# Patient Record
Sex: Male | Born: 1955 | Race: White | Hispanic: No | Marital: Married | Smoking: Never smoker
Health system: Southern US, Community
[De-identification: ages and names within clinical notes are randomized; demographics above are authoritative.]

## PROBLEM LIST (undated history)

## (undated) DIAGNOSIS — I214 Non-ST elevation (NSTEMI) myocardial infarction: Secondary | ICD-10-CM

## (undated) DIAGNOSIS — K649 Unspecified hemorrhoids: Secondary | ICD-10-CM

## (undated) DIAGNOSIS — Z86010 Personal history of colonic polyps: Secondary | ICD-10-CM

## (undated) DIAGNOSIS — I10 Essential (primary) hypertension: Secondary | ICD-10-CM

## (undated) DIAGNOSIS — I2510 Atherosclerotic heart disease of native coronary artery without angina pectoris: Secondary | ICD-10-CM

## (undated) DIAGNOSIS — Z951 Presence of aortocoronary bypass graft: Secondary | ICD-10-CM

## (undated) DIAGNOSIS — H409 Unspecified glaucoma: Secondary | ICD-10-CM

## (undated) HISTORY — PX: OTHER SURGICAL HISTORY: SHX169

## (undated) HISTORY — PX: CORONARY ANGIOPLASTY: SHX604

---

## 2015-11-15 ENCOUNTER — Encounter: Payer: Self-pay | Admitting: Emergency Medicine

## 2015-11-15 ENCOUNTER — Inpatient Hospital Stay
Admission: EM | Admit: 2015-11-15 | Discharge: 2015-11-17 | DRG: 282 | Payer: BLUE CROSS/BLUE SHIELD | Attending: Internal Medicine | Admitting: Internal Medicine

## 2015-11-15 ENCOUNTER — Emergency Department: Payer: BLUE CROSS/BLUE SHIELD

## 2015-11-15 DIAGNOSIS — I214 Non-ST elevation (NSTEMI) myocardial infarction: Secondary | ICD-10-CM | POA: Diagnosis not present

## 2015-11-15 DIAGNOSIS — I25110 Atherosclerotic heart disease of native coronary artery with unstable angina pectoris: Secondary | ICD-10-CM | POA: Diagnosis present

## 2015-11-15 DIAGNOSIS — R778 Other specified abnormalities of plasma proteins: Secondary | ICD-10-CM

## 2015-11-15 DIAGNOSIS — I10 Essential (primary) hypertension: Secondary | ICD-10-CM

## 2015-11-15 DIAGNOSIS — H409 Unspecified glaucoma: Secondary | ICD-10-CM | POA: Diagnosis present

## 2015-11-15 DIAGNOSIS — R7989 Other specified abnormal findings of blood chemistry: Secondary | ICD-10-CM

## 2015-11-15 DIAGNOSIS — I200 Unstable angina: Secondary | ICD-10-CM

## 2015-11-15 DIAGNOSIS — Z8249 Family history of ischemic heart disease and other diseases of the circulatory system: Secondary | ICD-10-CM

## 2015-11-15 HISTORY — DX: Essential (primary) hypertension: I10

## 2015-11-15 HISTORY — DX: Unspecified glaucoma: H40.9

## 2015-11-15 LAB — COMPREHENSIVE METABOLIC PANEL
ALK PHOS: 119 U/L (ref 38–126)
ALT: 27 U/L (ref 17–63)
AST: 32 U/L (ref 15–41)
Albumin: 4.1 g/dL (ref 3.5–5.0)
Anion gap: 6 (ref 5–15)
BILIRUBIN TOTAL: 0.4 mg/dL (ref 0.3–1.2)
BUN: 14 mg/dL (ref 6–20)
CALCIUM: 8.9 mg/dL (ref 8.9–10.3)
CO2: 27 mmol/L (ref 22–32)
CREATININE: 0.96 mg/dL (ref 0.61–1.24)
Chloride: 105 mmol/L (ref 101–111)
GFR calc Af Amer: >60 mL/min (ref >60)
Glucose, Bld: 87 mg/dL (ref 65–99)
Potassium: 4.2 mmol/L (ref 3.5–5.1)
Sodium: 138 mmol/L (ref 135–145)
TOTAL PROTEIN: 6.9 g/dL (ref 6.5–8.1)

## 2015-11-15 LAB — CBC
HEMATOCRIT: 44 % (ref 40.0–52.0)
Hemoglobin: 15.4 g/dL (ref 13.0–18.0)
MCH: 32 pg (ref 26.0–34.0)
MCHC: 34.9 g/dL (ref 32.0–36.0)
MCV: 91.7 fL (ref 80.0–100.0)
Platelets: 154 10*3/uL (ref 150–440)
RBC: 4.8 MIL/uL (ref 4.40–5.90)
RDW: 13.1 % (ref 11.5–14.5)
WBC: 7 10*3/uL (ref 3.8–10.6)

## 2015-11-15 LAB — FIBRIN DERIVATIVES D-DIMER (ARMC ONLY): Fibrin derivatives D-dimer (ARMC): 354 (ref 0–499)

## 2015-11-15 LAB — APTT: aPTT: 31 seconds (ref 24–36)

## 2015-11-15 LAB — TROPONIN I
TROPONIN I: 0.58 ng/mL — AB (ref <0.03)
TROPONIN I: 0.47 ng/mL — AB (ref <0.03)

## 2015-11-15 LAB — PROTIME-INR
INR: 1.07
Prothrombin Time: 13.9 seconds (ref 11.4–15.2)

## 2015-11-15 MED ORDER — ACETAMINOPHEN 325 MG PO TABS
650.0000 mg | ORAL_TABLET | Freq: Four times a day (QID) | ORAL | Status: DC | PRN
  Administered 2015-11-16: 650 mg via ORAL
  Filled 2015-11-15: qty 2

## 2015-11-15 MED ORDER — ASPIRIN EC 81 MG PO TBEC
81.0000 mg | DELAYED_RELEASE_TABLET | Freq: Every day | ORAL | Status: DC
  Administered 2015-11-16 – 2015-11-17 (×2): 81 mg via ORAL
  Filled 2015-11-15 (×2): qty 1

## 2015-11-15 MED ORDER — ONDANSETRON HCL 4 MG PO TABS: 4.0000 mg | ORAL_TABLET | Freq: Four times a day (QID) | ORAL | Status: DC | PRN

## 2015-11-15 MED ORDER — ONDANSETRON HCL 4 MG/2ML IJ SOLN: 4.0000 mg | Freq: Four times a day (QID) | INTRAMUSCULAR | Status: DC | PRN

## 2015-11-15 MED ORDER — HYDRALAZINE HCL 20 MG/ML IJ SOLN: 10.0000 mg | Freq: Four times a day (QID) | INTRAMUSCULAR | Status: DC | PRN

## 2015-11-15 MED ORDER — NITROGLYCERIN 0.4 MG SL SUBL: 0.4000 mg | SUBLINGUAL_TABLET | SUBLINGUAL | Status: DC | PRN

## 2015-11-15 MED ORDER — ASPIRIN 81 MG PO CHEW
243.0000 mg | CHEWABLE_TABLET | Freq: Once | ORAL | Status: AC
  Administered 2015-11-15: 243 mg via ORAL
  Filled 2015-11-15: qty 3

## 2015-11-15 MED ORDER — FAMOTIDINE IN NACL 20-0.9 MG/50ML-% IV SOLN
20.0000 mg | Freq: Two times a day (BID) | INTRAVENOUS | Status: DC
  Administered 2015-11-15: 20 mg via INTRAVENOUS
  Filled 2015-11-15 (×4): qty 50

## 2015-11-15 MED ORDER — NITROGLYCERIN 2 % TD OINT
1.0000 [in_us] | TOPICAL_OINTMENT | Freq: Four times a day (QID) | TRANSDERMAL | Status: DC
  Administered 2015-11-15 – 2015-11-17 (×6): 1 [in_us] via TOPICAL
  Filled 2015-11-15 (×6): qty 1

## 2015-11-15 MED ORDER — BISACODYL 10 MG RE SUPP: 10.0000 mg | Freq: Every day | RECTAL | Status: DC | PRN

## 2015-11-15 MED ORDER — METOPROLOL TARTRATE 25 MG PO TABS
25.0000 mg | ORAL_TABLET | Freq: Two times a day (BID) | ORAL | Status: DC
  Administered 2015-11-15: 25 mg via ORAL
  Filled 2015-11-15: qty 1

## 2015-11-15 MED ORDER — LORAZEPAM 2 MG/ML IJ SOLN: 0.5000 mg | INTRAMUSCULAR | Status: DC | PRN

## 2015-11-15 MED ORDER — DOCUSATE SODIUM 100 MG PO CAPS
100.0000 mg | ORAL_CAPSULE | Freq: Two times a day (BID) | ORAL | Status: DC
  Administered 2015-11-16 – 2015-11-17 (×3): 100 mg via ORAL
  Filled 2015-11-15 (×4): qty 1

## 2015-11-15 MED ORDER — ACETAMINOPHEN 650 MG RE SUPP
650.0000 mg | Freq: Four times a day (QID) | RECTAL | Status: DC | PRN
Start: 2015-11-15 — End: 2015-11-17

## 2015-11-15 MED ORDER — HEPARIN (PORCINE) IN NACL 100-0.45 UNIT/ML-% IJ SOLN
1150.0000 [IU]/h | INTRAMUSCULAR | Status: DC
  Administered 2015-11-15 – 2015-11-17 (×3): 1150 [IU]/h via INTRAVENOUS
  Filled 2015-11-15 (×6): qty 250

## 2015-11-15 MED ORDER — LATANOPROST 0.005 % OP SOLN
1.0000 [drp] | Freq: Every day | OPHTHALMIC | Status: DC
  Filled 2015-11-15: qty 2.5

## 2015-11-15 MED ORDER — SODIUM CHLORIDE 0.9 % IV SOLN
INTRAVENOUS | Status: DC
  Administered 2015-11-16: 07:00:00 via INTRAVENOUS

## 2015-11-15 MED ORDER — SODIUM CHLORIDE 0.9% FLUSH
3.0000 mL | Freq: Two times a day (BID) | INTRAVENOUS | Status: DC
  Administered 2015-11-15: 3 mL via INTRAVENOUS

## 2015-11-15 MED ORDER — HEPARIN BOLUS VIA INFUSION
4000.0000 [IU] | Freq: Once | INTRAVENOUS | Status: AC
  Administered 2015-11-15: 4000 [IU] via INTRAVENOUS
  Filled 2015-11-15: qty 4000

## 2015-11-15 MED ORDER — MORPHINE SULFATE (PF) 2 MG/ML IV SOLN: 2.0000 mg | INTRAVENOUS | Status: DC | PRN

## 2015-11-15 NOTE — H&P (Signed)
History and Physical    Clinton Lyons [id number] DOB: 1956-02-25 DOA: 11/15/2015  Referring physician: Dr. Kerman Passey PCP: No primary care provider on file.  Specialists: none  Chief Complaint: chest pain  HPI: Clinton Lyons is a 60 y.o. male has a past medical history significant for glaucoma who presents to ER with recurrent episodes of left chest pain described as tightness radiating to left arm. Had an episode last night at rest lasting over 30 minutes. Denies sweats, SOB or N/V. In ER, ekg non-acute but troponin mildly elevated. He is now admitted. No personal cardiac hx but does have FH of CAD.  Review of Systems: The patient denies anorexia, fever, weight loss,, vision loss, decreased hearing, hoarseness,  syncope, dyspnea on exertion, peripheral edema, balance deficits, hemoptysis, abdominal pain, melena, hematochezia, severe indigestion/heartburn, hematuria, incontinence, genital sores, muscle weakness, suspicious skin lesions, transient blindness, difficulty walking, depression, unusual weight change, abnormal bleeding, enlarged lymph nodes, angioedema, and breast masses.   Past Medical History:  Diagnosis Date  . Glaucoma    History reviewed. No pertinent surgical history. Social History:  reports that he has never smoked. He has never used smokeless tobacco. He reports that he drinks about 1.2 oz of alcohol per week . He reports that he does not use drugs.  No Known Allergies  Family History  Problem Relation Age of Onset  . CAD Brother     Prior to Admission medications   Not on File   Physical Exam: Vitals:   11/15/15 1405 11/15/15 1408 11/15/15 1612  BP: (!) 140/97  (!) 139/91  Pulse: 92  94  Resp:   18  Temp: 98.4 F (36.9 C)    TempSrc: Oral    SpO2: 98%  97%  Weight:  93 kg (205 lb)   Height:  5\' 9"  (1.753 m)      General:  No apparent distress, WDWN, [place]/AT  Eyes: PERRL, EOMI, no scleral icterus, conjunctiva clear  ENT: moist  oropharynx without exudate, TM's benign, dentition good  Neck: supple, no lymphadenopathy. No bruits or thyromegaly  Cardiovascular: regular rate without MRG; 2+ peripheral pulses, no JVD, no peripheral edema  Respiratory: CTA biL, good air movement without wheezing, rhonchi or crackled. Respiratory effort normal  Abdomen: soft, non tender to palpation, positive bowel sounds, no guarding, no rebound  Skin: no rashes or lesions  Musculoskeletal: normal bulk and tone, no joint swelling  Psychiatric: normal mood and affect, A&OX3  Neurologic: CN 2-12 grossly intact, Motor strength 5/5 in all 4 groups with symmetric DTR's and non-focal sensory exam  Labs on Admission:  Basic Metabolic Panel:  Recent Labs Lab 11/15/15 1412  NA 138  K 4.2  CL 105  CO2 27  GLUCOSE 87  BUN 14  CREATININE 0.96  CALCIUM 8.9   Liver Function Tests:  Recent Labs Lab 11/15/15 1412  AST 32  ALT 27  ALKPHOS 119  BILITOT 0.4  PROT 6.9  ALBUMIN 4.1   No results for input(s): LIPASE, AMYLASE in the last 168 hours. No results for input(s): AMMONIA in the last 168 hours. CBC:  Recent Labs Lab 11/15/15 1412  WBC 7.0  HGB 15.4  HCT 44.0  MCV 91.7  PLT 154   Cardiac Enzymes:  Recent Labs Lab 11/15/15 1412  TROPONINI 0.58*    BNP (last 3 results) No results for input(s): BNP in the last 8760 hours.  ProBNP (last 3 results) No results for input(s): PROBNP in the last 8760 hours.  CBG: No  results for input(s): GLUCAP in the last 168 hours.  Radiological Exams on Admission: Dg Chest 2 View  Result Date: 11/15/2015 CLINICAL DATA:  Chest discomfort EXAM: CHEST  2 VIEW COMPARISON:  None. FINDINGS: The heart size and mediastinal contours are within normal limits. Both lungs are clear. The visualized skeletal structures are unremarkable. IMPRESSION: No active cardiopulmonary disease. Electronically Signed   By: Dorise Bullion III M.D   On: 11/15/2015 15:23    EKG: Independently  reviewed.  Assessment/Plan Active Problems:   Unstable angina (HCC)   Elevated troponin   Accelerated hypertension   Glaucoma   Will observe on telemetry and begin BP meds. Follow enzymes and order echo. Consult Cardiology for cath. Repeat labs in AM. On Heparin drip per ER.  Diet: low salt Fluids: NS@75  DVT Prophylaxis: IV Heparin  Code Status: FULL  Family Communication: yes  Disposition Plan: home  Time spent: 50 min

## 2015-11-15 NOTE — ED Notes (Signed)
Pt. States having "episodes of a dull achy chest pain that radiates to the left arm." denies diaphoresis, denies N/V, denies palpitations. last pm episode worst so far last approx. 20 minutes. Pt. Reports taking 2 81 mg aspirin and pain easing off. Pt. Denying pain at this time.

## 2015-11-15 NOTE — ED Provider Notes (Signed)
[facility] Emergency Department Provider Note  Time seen: 3:55 PM  I have reviewed the triage vital signs and the nursing notes.   HISTORY  Chief Complaint Chest Pain    HPI Clinton Lyons is a 60 y.o. male With a past medical history of glaucoma who presents the emergency department for chest pain. According to the patient over the past 2 weeks he has had 7 episodes of chest pain/tightness with occasional tingling and pressure in his left arm. States the symptoms have lasted for 5-10 minutes  Resolved. Patient states around 1:30 AM overnight he had an approximate 15 minute episode of chest tightness with tingling and pressure in the left arm. States it resolved on its own he went back to sleep. Patient states he was concerned enough with the then overnight that he came to the emergency department today for evaluation. States his brother recently had a cardiac bypass. The patient denies any trouble breathing, nausea or diaphoresis.   Past Medical History:  Diagnosis Date  . Glaucoma     There are no active problems to display for this patient.   History reviewed. No pertinent surgical history.  Prior to Admission medications   Not on File    No Known Allergies  History reviewed. No pertinent family history.  Social History Social History  Substance Use Topics  . Smoking status: Never Smoker  . Smokeless tobacco: Never Used  . Alcohol use 1.2 oz/week    2 Cans of beer per week    Review of Systems Constitutional: Negative for fever. Cardiovascular: Negative for chest pain. Respiratory: Negative for shortness of breath. Gastrointestinal: Negative for abdominal pain Musculoskeletal: Negative for back pain. Neurological: Negative for headache 10-point ROS otherwise negative.  ____________________________________________   PHYSICAL EXAM:  VITAL SIGNS: ED Triage Vitals  Enc Vitals Group     BP 11/15/15 1405 (!) 140/97     Pulse Rate  11/15/15 1405 92     Resp --      Temp 11/15/15 1405 98.4 F (36.9 C)     Temp Source 11/15/15 1405 Oral     SpO2 11/15/15 1405 98 %     Weight 11/15/15 1408 205 lb (93 kg)     Height 11/15/15 1408 5\' 9"  (1.753 m)     Head Circumference --      Peak Flow --      Pain Score 11/15/15 1507 0     Pain Loc --      Pain Edu? --      Excl. in [place]? --     Constitutional: Alert and oriented. Well appearing and in no distress. Eyes: Normal exam ENT   Head: Normocephalic and atraumatic   Mouth/Throat: Mucous membranes are moist. Cardiovascular: Normal rate, regular rhythm. No murmur Respiratory: Normal respiratory effort without tachypnea nor retractions. Breath sounds are clear and equal Gastrointestinal: Soft and nontender. No distention.  Musculoskeletal: Nontender with normal range of motion in all extremities. No lower extremity tenderness or edema. Neurologic:  Normal speech and language. No gross focal neurologic deficits are appreciated. Skin:  Skin is warm, dry and intact.  Psychiatric: Mood and affect are normal. Speech and behavior are normal.   ____________________________________________    EKG  EKG reviewed and interpreted by myself shows normal sinus rhythm at 87 bpm, narrow QRS, normal axis, normal intervals, no concerning ST changes.  ____________________________________________    RADIOLOGY  Chest x-ray negative  ____________________________________________   INITIAL IMPRESSION / ASSESSMENT AND PLAN /  ED COURSE  Pertinent labs & imaging results that were available during my care of the patient were reviewed by me and considered in my medical decision making (see chart for details).  The patient presents the emergency department for chest pain which has been occurring intermittently over the past 2 weeks, the worst episode occurred last night around 1:30 in the morning. Patient denies any discomfort at this time, denies any trouble breathing nausea or  diaphoresis at any point. Patient's troponin is significantly elevated 0.58 consistent with unstable angina versus NSTEMI. Patient has a normal physical exam at this time, EKG shows no concerning findings. Chest x-ray is clear. We will start the patient on a heparin drip, dose 3 additional baby aspirin, and admit to the hospital for further treatment.   CRITICAL CARE Performed by: Harvest Dark   Total critical care time: 30 minutes  Critical care time was exclusive of separately billable procedures and treating other patients.  Critical care was necessary to treat or prevent imminent or life-threatening deterioration.  Critical care was time spent personally by me on the following activities: development of treatment plan with patient and/or surrogate as well as nursing, discussions with consultants, evaluation of patient's response to treatment, examination of patient, obtaining history from patient or surrogate, ordering and performing treatments and interventions, ordering and review of laboratory studies, ordering and review of radiographic studies, pulse oximetry and re-evaluation of patient's condition.   ____________________________________________   FINAL CLINICAL IMPRESSION(S) / ED DIAGNOSES  NSTEMI    Harvest Dark, MD 11/15/15 [phone]

## 2015-11-15 NOTE — Progress Notes (Signed)
ANTICOAGULATION CONSULT NOTE - Initial Consult  Pharmacy Consult for heparin bolus and drip Indication: chest pain/ACS  No Known Allergies  Patient Measurements: Height: 5\' 9"  (175.3 cm) Weight: 205 lb (93 kg) IBW/kg (Calculated) : 70.7 Heparin Dosing Weight: 89.8  Vital Signs: Temp: 98.4 F (36.9 C) (08/20 1405) Temp Source: Oral (08/20 1405) BP: 139/91 (08/20 1612) Pulse Rate: 94 (08/20 1612)  Labs:  Recent Labs  11/15/15 1412  HGB 15.4  HCT 44.0  PLT 154  APTT 31  LABPROT 13.9  INR 1.07  CREATININE 0.96  TROPONINI 0.58*    Estimated Creatinine Clearance: 91 mL/min (by C-G formula based on SCr of 0.96 mg/dL).   Medical History: Past Medical History:  Diagnosis Date  . Glaucoma     Medications:  Scheduled:  . heparin  4,000 Units Intravenous Once  . metoprolol tartrate  25 mg Oral BID  . nitroGLYCERIN  1 inch Topical Q6H   Infusions:  . sodium chloride    . famotidine (PEPCID) IV    . heparin      Assessment: Pharmacy has been consulted to dose heparin drip and bolus for this 10 yoM. Patient reports taking no anticoagulants prior to admission. Baseline labs were ordered. HL ordered for 6 hours after start of heparin (08/20 @ 2300). CBC order with 08/21 AM labs.   Goal of Therapy:  Heparin level 0.3-0.7 units/ml Monitor platelets by anticoagulation protocol: Yes   Plan:  Give 4000 units bolus x 1 Start heparin infusion at 1150 units/hr Check anti-Xa level in 6 hours and daily while on heparin Continue to monitor H&H and platelets  Darrow Bussing, PharmD Pharmacy Resident 11/15/2015 4:58 PM

## 2015-11-15 NOTE — ED Triage Notes (Signed)
[place] Infrequent episodes of chest pressure. Had an episode where he awoke out of sleep with pain x 20 mins which then subsided.

## 2015-11-15 NOTE — ED Notes (Signed)
Admitting MD at bedside.

## 2015-11-16 ENCOUNTER — Observation Stay
Admit: 2015-11-16 | Discharge: 2015-11-16 | Disposition: A | Discharge status: Home / Self Care | Payer: BLUE CROSS/BLUE SHIELD | Attending: Internal Medicine | Admitting: Internal Medicine

## 2015-11-16 ENCOUNTER — Encounter
Admission: EM | Disposition: A | Discharge status: Other Acute Inpatient Hospital | Payer: Self-pay | Source: Home / Self Care | Attending: Internal Medicine

## 2015-11-16 DIAGNOSIS — H409 Unspecified glaucoma: Secondary | ICD-10-CM | POA: Diagnosis present

## 2015-11-16 DIAGNOSIS — I214 Non-ST elevation (NSTEMI) myocardial infarction: Secondary | ICD-10-CM

## 2015-11-16 DIAGNOSIS — Z8249 Family history of ischemic heart disease and other diseases of the circulatory system: Secondary | ICD-10-CM | POA: Diagnosis not present

## 2015-11-16 DIAGNOSIS — I10 Essential (primary) hypertension: Secondary | ICD-10-CM | POA: Diagnosis present

## 2015-11-16 DIAGNOSIS — I25110 Atherosclerotic heart disease of native coronary artery with unstable angina pectoris: Secondary | ICD-10-CM | POA: Diagnosis present

## 2015-11-16 HISTORY — DX: Non-ST elevation (NSTEMI) myocardial infarction: I21.4

## 2015-11-16 HISTORY — PX: CARDIAC CATHETERIZATION: SHX172

## 2015-11-16 LAB — COMPREHENSIVE METABOLIC PANEL WITH GFR
ALT: 22 U/L (ref 17–63)
AST: 25 U/L (ref 15–41)
Albumin: 3.4 g/dL — ABNORMAL LOW (ref 3.5–5.0)
Alkaline Phosphatase: 93 U/L (ref 38–126)
Anion gap: 4 — ABNORMAL LOW (ref 5–15)
BUN: 13 mg/dL (ref 6–20)
CO2: 29 mmol/L (ref 22–32)
Calcium: 8.5 mg/dL — ABNORMAL LOW (ref 8.9–10.3)
Chloride: 106 mmol/L (ref 101–111)
Creatinine, Ser: 0.88 mg/dL (ref 0.61–1.24)
GFR calc Af Amer: >60 mL/min
GFR calc non Af Amer: >60 mL/min
Glucose, Bld: 96 mg/dL (ref 65–99)
Potassium: 4.3 mmol/L (ref 3.5–5.1)
Sodium: 139 mmol/L (ref 135–145)
Total Bilirubin: 1 mg/dL (ref 0.3–1.2)
Total Protein: 6.1 g/dL — ABNORMAL LOW (ref 6.5–8.1)

## 2015-11-16 LAB — HEPARIN LEVEL (UNFRACTIONATED)
Heparin Unfractionated: 0.36 [IU]/mL (ref 0.30–0.70)
Heparin Unfractionated: 0.46 IU/mL (ref 0.30–0.70)

## 2015-11-16 LAB — ECHOCARDIOGRAM COMPLETE
Height: 69 in
Weight: 3372.8 oz

## 2015-11-16 LAB — TROPONIN I
Troponin I: 0.33 ng/mL
Troponin I: 0.48 ng/mL

## 2015-11-16 LAB — CBC
HEMATOCRIT: 40.7 % (ref 40.0–52.0)
Hemoglobin: 14.2 g/dL (ref 13.0–18.0)
MCH: 31.9 pg (ref 26.0–34.0)
MCHC: 34.7 g/dL (ref 32.0–36.0)
MCV: 91.9 fL (ref 80.0–100.0)
PLATELETS: 139 10*3/uL — AB (ref 150–440)
RBC: 4.43 MIL/uL (ref 4.40–5.90)
RDW: 12.8 % (ref 11.5–14.5)
WBC: 7.3 10*3/uL (ref 3.8–10.6)

## 2015-11-16 LAB — LIPID PANEL
Cholesterol: 161 mg/dL (ref 0–200)
HDL: 49 mg/dL
LDL Cholesterol: 98 mg/dL (ref 0–99)
Total CHOL/HDL Ratio: 3.3 ratio
Triglycerides: 72 mg/dL
VLDL: 14 mg/dL (ref 0–40)

## 2015-11-16 SURGERY — LEFT HEART CATH AND CORONARY ANGIOGRAPHY
Anesthesia: Moderate Sedation

## 2015-11-16 MED ORDER — METOPROLOL SUCCINATE ER 25 MG PO TB24
25.0000 mg | ORAL_TABLET | Freq: Every day | ORAL | Status: DC
  Administered 2015-11-16 – 2015-11-17 (×2): 25 mg via ORAL
  Filled 2015-11-16 (×2): qty 1

## 2015-11-16 MED ORDER — SODIUM CHLORIDE 0.9% FLUSH: 3.0000 mL | INTRAVENOUS | Status: DC | PRN

## 2015-11-16 MED ORDER — IOPAMIDOL (ISOVUE-300) INJECTION 61%
INTRAVENOUS | Status: DC | PRN
  Administered 2015-11-16: 130 mL via INTRA_ARTERIAL

## 2015-11-16 MED ORDER — ONDANSETRON HCL 4 MG/2ML IJ SOLN: 4.0000 mg | Freq: Four times a day (QID) | INTRAMUSCULAR | Status: DC | PRN

## 2015-11-16 MED ORDER — FENTANYL CITRATE (PF) 100 MCG/2ML IJ SOLN
INTRAMUSCULAR | Status: AC
  Filled 2015-11-16: qty 2

## 2015-11-16 MED ORDER — MIDAZOLAM HCL 2 MG/2ML IJ SOLN
INTRAMUSCULAR | Status: AC
  Filled 2015-11-16: qty 2

## 2015-11-16 MED ORDER — SODIUM CHLORIDE 0.9 % WEIGHT BASED INFUSION
3.0000 mL/kg/h | INTRAVENOUS | Status: AC
  Administered 2015-11-16: 3 mL/kg/h via INTRAVENOUS

## 2015-11-16 MED ORDER — HEPARIN (PORCINE) IN NACL 2-0.9 UNIT/ML-% IJ SOLN
INTRAMUSCULAR | Status: AC
  Filled 2015-11-16: qty 500

## 2015-11-16 MED ORDER — SODIUM CHLORIDE 0.9 % WEIGHT BASED INFUSION: 3.0000 mL/kg/h | INTRAVENOUS | Status: DC

## 2015-11-16 MED ORDER — SODIUM CHLORIDE 0.9% FLUSH
3.0000 mL | Freq: Two times a day (BID) | INTRAVENOUS | Status: DC
  Administered 2015-11-16 – 2015-11-17 (×2): 3 mL via INTRAVENOUS

## 2015-11-16 MED ORDER — SODIUM CHLORIDE 0.9% FLUSH: 3.0000 mL | Freq: Two times a day (BID) | INTRAVENOUS | Status: DC

## 2015-11-16 MED ORDER — MIDAZOLAM HCL 2 MG/2ML IJ SOLN
INTRAMUSCULAR | Status: DC | PRN
  Administered 2015-11-16: 1 mg via INTRAVENOUS

## 2015-11-16 MED ORDER — LORAZEPAM 2 MG/ML IJ SOLN: 0.5000 mg | Freq: Two times a day (BID) | INTRAMUSCULAR | Status: DC | PRN

## 2015-11-16 MED ORDER — ACETAMINOPHEN 325 MG PO TABS: 650.0000 mg | ORAL_TABLET | ORAL | Status: DC | PRN

## 2015-11-16 MED ORDER — SODIUM CHLORIDE 0.9 % IV SOLN: 250.0000 mL | INTRAVENOUS | Status: DC | PRN

## 2015-11-16 MED ORDER — SODIUM CHLORIDE 0.9 % WEIGHT BASED INFUSION: 1.0000 mL/kg/h | INTRAVENOUS | Status: DC

## 2015-11-16 MED ORDER — ATORVASTATIN CALCIUM 20 MG PO TABS
40.0000 mg | ORAL_TABLET | Freq: Every day | ORAL | Status: DC
Start: 2015-11-16 — End: 2015-11-17
  Administered 2015-11-16: 40 mg via ORAL
  Filled 2015-11-16: qty 2

## 2015-11-16 MED ORDER — FENTANYL CITRATE (PF) 100 MCG/2ML IJ SOLN
INTRAMUSCULAR | Status: DC | PRN
  Administered 2015-11-16: 25 ug via INTRAVENOUS

## 2015-11-16 MED ORDER — ASPIRIN 81 MG PO CHEW: 81.0000 mg | CHEWABLE_TABLET | ORAL | Status: DC

## 2015-11-16 SURGICAL SUPPLY — 9 items
CATH INFINITI 5FR ANG PIGTAIL (CATHETERS) ×3 IMPLANT
CATH INFINITI 5FR JL4 (CATHETERS) ×3 IMPLANT
CATH INFINITI JR4 5F (CATHETERS) ×3 IMPLANT
DEVICE CLOSURE MYNXGRIP 5F (Vascular Products) ×3 IMPLANT
KIT MANI 3VAL PERCEP (MISCELLANEOUS) ×3 IMPLANT
NEEDLE PERC 18GX7CM (NEEDLE) ×3 IMPLANT
PACK CARDIAC CATH (CUSTOM PROCEDURE TRAY) ×3 IMPLANT
SHEATH AVANTI 5FR X 11CM (SHEATH) ×3 IMPLANT
WIRE EMERALD 3MM-J .035X150CM (WIRE) ×3 IMPLANT

## 2015-11-16 NOTE — Progress Notes (Signed)
*  PRELIMINARY RESULTS* Echocardiogram 2D Echocardiogram has been performed.  Sherrie Sport 11/16/2015, 9:58 AM

## 2015-11-16 NOTE — Consult Note (Signed)
[facility] Cardiology  CARDIOLOGY CONSULT NOTE  Patient ID: Clinton Lyons MRN: [id number] DOB/AGE: 1955-09-02 60 y.o.  Admit date: 11/15/2015 Referring Physician [place] Primary Physician [facility] Primary Cardiologist Arash Karstens Reason for Consultation Non-ST elevation myocardial infarction  HPI: 60 year old gentleman referred for evaluation of non-ST elevation myocardial infarction. The patient recently moved to [place] from [place]. He reports two-month history of chest pain. He developed substernal chest tightness with radiation to his left arm with exertion, with such activities as lifting furniture, walking up steps or up hill. He was in his usual state of health until the night of admission, developed substernal chest pain while laying in bed. Patient presented to [facility] emergency room where ECG was nondiagnostic. Troponin was elevated to 0.58.  Review of systems complete and found to be negative unless listed above     Past Medical History:  Diagnosis Date  . Glaucoma     History reviewed. No pertinent surgical history.  Prescriptions Prior to Admission  Medication Sig Dispense Refill Last Dose  . aspirin 81 MG chewable tablet Chew 81 mg by mouth every morning.   11/15/2015 at Unknown time  . Cholecalciferol (VITAMIN D3) 2000 units capsule Take 2,000 Units by mouth every morning.   11/15/2015 at Unknown time  . glucosamine-chondroitin 500-400 MG tablet Take 1 tablet by mouth every morning.   11/15/2015 at Unknown time  . latanoprost (XALATAN) 0.005 % ophthalmic solution Place 1 drop into the right eye at bedtime.   11/14/2015 at Unknown time  . Multiple Vitamins-Minerals (PX ADVANCED FORMULA MULTIVITS) TABS Take 1 tablet by mouth every morning.   11/15/2015 at Unknown time   Social History   Social History  . Marital status: Married    Spouse name: N/A  . Number of children: N/A  . Years of education: N/A   Occupational History  . Not on file.   Social History Main Topics   . Smoking status: Never Smoker  . Smokeless tobacco: Never Used  . Alcohol use 1.2 oz/week    2 Cans of beer per week  . Drug use: No  . Sexual activity: Not on file   Other Topics Concern  . Not on file   Social History Narrative  . No narrative on file    Family History  Problem Relation Age of Onset  . CAD Brother       Review of systems complete and found to be negative unless listed above      PHYSICAL EXAM  General: Well developed, well nourished, in no acute distress HEENT:  Normocephalic and atramatic Neck:  No JVD.  Lungs: Clear bilaterally to auscultation and percussion. Heart: HRRR . Normal S1 and S2 without gallops or murmurs.  Abdomen: Bowel sounds are positive, abdomen soft and non-tender  Msk:  Back normal, normal gait. Normal strength and tone for age. Extremities: No clubbing, cyanosis or edema.   Neuro: Alert and oriented X 3. Psych:  Good affect, responds appropriately  Labs:   Lab Results  Component Value Date   WBC 7.3 11/16/2015   HGB 14.2 11/16/2015   HCT 40.7 11/16/2015   MCV 91.9 11/16/2015   PLT 139 (L) 11/16/2015    Recent Labs Lab 11/16/15 0605  NA 139  K 4.3  CL 106  CO2 29  BUN 13  CREATININE 0.88  CALCIUM 8.5*  PROT 6.1*  BILITOT 1.0  ALKPHOS 93  ALT 22  AST 25  GLUCOSE 96   Lab Results  Component Value Date  TROPONINI 0.33 (HH) 11/16/2015    Lab Results  Component Value Date   CHOL 161 11/16/2015   Lab Results  Component Value Date   HDL 49 11/16/2015   Lab Results  Component Value Date   LDLCALC 98 11/16/2015   Lab Results  Component Value Date   TRIG 72 11/16/2015   Lab Results  Component Value Date   CHOLHDL 3.3 11/16/2015   No results found for: LDLDIRECT    Radiology: Dg Chest 2 View  Result Date: 11/15/2015 CLINICAL DATA:  Chest discomfort EXAM: CHEST  2 VIEW COMPARISON:  None. FINDINGS: The heart size and mediastinal contours are within normal limits. Both lungs are clear. The  visualized skeletal structures are unremarkable. IMPRESSION: No active cardiopulmonary disease. Electronically Signed   By: Dorise Bullion III M.D   On: 11/15/2015 15:23    EKG: Sinus rhythm  ASSESSMENT AND PLAN:   1. Non-ST elevation myocardial infarction, currently chest pain-free  Recommendations  1. Agree with current therapy 2. Proceed with cardiac catheterization with selective coronary arteriography. The risks, benefits alternatives of cardiac catheterization were explained to the patient and informed consent was obtained.   SignedIsaias Cowman MD,PhD, [facility] 11/16/2015, 7:52 AM

## 2015-11-16 NOTE — Progress Notes (Signed)
Pt taken to cath lab. Heparin gtt stopped

## 2015-11-16 NOTE — Progress Notes (Signed)
Report to susan on telemetry.  Check right groin for bleeding or hematoma.  Patient will be on bedrest for 1 hours post sheath pull---out of bed at 13:40.  Bilateral pulses are 2's DP's.Marland Kitchen

## 2015-11-16 NOTE — Progress Notes (Signed)
ANTICOAGULATION CONSULT NOTE - Initial Consult  Pharmacy Consult for heparin bolus and drip Indication: chest pain/ACS  No Known Allergies  Patient Measurements: Height: 5\' 9"  (175.3 cm) Weight: 209 lb 1.6 oz (94.8 kg) IBW/kg (Calculated) : 70.7 Heparin Dosing Weight: 89.8  Vital Signs: Temp: 98.2 F (36.8 C) (08/20 2045) Temp Source: Oral (08/20 2045) BP: 117/78 (08/20 2341) Pulse Rate: 75 (08/20 2341)  Labs:  Recent Labs  11/15/15 1412 11/15/15 1753 11/15/15 2348  HGB 15.4  --   --   HCT 44.0  --   --   PLT 154  --   --   APTT 31  --   --   LABPROT 13.9  --   --   INR 1.07  --   --   HEPARINUNFRC  --   --  0.46  CREATININE 0.96  --   --   TROPONINI 0.58* 0.47*  --     Estimated Creatinine Clearance: 91.8 mL/min (by C-G formula based on SCr of 0.96 mg/dL).   Medical History: Past Medical History:  Diagnosis Date  . Glaucoma     Medications:  Scheduled:  . aspirin EC  81 mg Oral Daily  . docusate sodium  100 mg Oral BID  . famotidine (PEPCID) IV  20 mg Intravenous Q12H  . latanoprost  1 drop Right Eye QHS  . metoprolol tartrate  25 mg Oral BID  . nitroGLYCERIN  1 inch Topical Q6H  . sodium chloride flush  3 mL Intravenous Q12H   Infusions:  . sodium chloride    . heparin 1,150 Units/hr (11/15/15 1756)    Assessment: Pharmacy has been consulted to dose heparin drip and bolus for this 32 yoM. Patient reports taking no anticoagulants prior to admission. Baseline labs were ordered. HL ordered for 6 hours after start of heparin (08/20 @ 2300). CBC order with 08/21 AM labs.   Goal of Therapy:  Heparin level 0.3-0.7 units/ml Monitor platelets by anticoagulation protocol: Yes   Plan:  Give 4000 units bolus x 1 Start heparin infusion at 1150 units/hr Check anti-Xa level in 6 hours and daily while on heparin Continue to monitor H&H and platelets   8/21:  HL @ 23:48 = 0.46 Will continue this pt on current rate and recheck HL in 6 hrs.    Jaidynn Balster D 11/16/2015 12:39 AM

## 2015-11-16 NOTE — Progress Notes (Signed)
ANTICOAGULATION CONSULT NOTE - Initial Consult  Pharmacy Consult for heparin bolus and drip Indication: chest pain/ACS  No Known Allergies  Patient Measurements: Height: 5\' 9"  (175.3 cm) Weight: 209 lb 1.6 oz (94.8 kg) IBW/kg (Calculated) : 70.7 Heparin Dosing Weight: 89.8  Vital Signs: Temp: 98.3 F (36.8 C) (08/21 0522) Temp Source: Oral (08/21 0522) BP: 113/77 (08/21 0522) Pulse Rate: 75 (08/21 0522)  Labs:  Recent Labs  11/15/15 1412 11/15/15 1753 11/15/15 2348 11/16/15 0605  HGB 15.4  --   --  14.2  HCT 44.0  --   --  40.7  PLT 154  --   --  139*  APTT 31  --   --   --   LABPROT 13.9  --   --   --   INR 1.07  --   --   --   HEPARINUNFRC  --   --  0.46 0.36  CREATININE 0.96  --   --  0.88  TROPONINI 0.58* 0.47* 0.48*  --     Estimated Creatinine Clearance: 100.1 mL/min (by C-G formula based on SCr of 0.88 mg/dL).   Medical History: Past Medical History:  Diagnosis Date  . Glaucoma     Medications:  Scheduled:  . aspirin EC  81 mg Oral Daily  . docusate sodium  100 mg Oral BID  . famotidine (PEPCID) IV  20 mg Intravenous Q12H  . latanoprost  1 drop Right Eye QHS  . metoprolol tartrate  25 mg Oral BID  . nitroGLYCERIN  1 inch Topical Q6H  . sodium chloride flush  3 mL Intravenous Q12H   Infusions:  . sodium chloride 75 mL/hr at 11/16/15 [id number]  . heparin 1,150 Units/hr (11/15/15 1756)    Assessment: Pharmacy has been consulted to dose heparin drip and bolus for this 69 yoM. Patient reports taking no anticoagulants prior to admission. Baseline labs were ordered. HL ordered for 6 hours after start of heparin (08/20 @ 2300). CBC order with 08/21 AM labs.   Goal of Therapy:  Heparin level 0.3-0.7 units/ml Monitor platelets by anticoagulation protocol: Yes   Plan:  Give 4000 units bolus x 1 Start heparin infusion at 1150 units/hr Check anti-Xa level in 6 hours and daily while on heparin Continue to monitor H&H and platelets   8/20:  HL @ 23:48 =  0.46 Will continue this pt on current rate and recheck HL in 6 hrs.   8/21: HL @ 0600 = 0.36 Will continue this pt on current rate and recheck HL on 8/22 with AM labs.   Kinslea Frances D 11/16/2015 6:43 AM

## 2015-11-16 NOTE — Progress Notes (Signed)
[place] [facility] [place] NAME: Clinton Lyons    MR#:  [id number]  DATE OF BIRTH:  [birth year]  SUBJECTIVE:  CHIEF COMPLAINT:   Chief Complaint  Patient presents with  . Chest Pain   No further chest pain  Cath later today On heparin drip  REVIEW OF SYSTEMS:    Review of Systems  Constitutional: Negative for chills and fever.  HENT: Negative for sore throat.   Eyes: Negative for blurred vision, double vision and pain.  Respiratory: Negative for cough, hemoptysis, shortness of breath and wheezing.   Cardiovascular: Negative for chest pain, palpitations, orthopnea and leg swelling.  Gastrointestinal: Negative for abdominal pain, constipation, diarrhea, heartburn, nausea and vomiting.  Genitourinary: Negative for dysuria and hematuria.  Musculoskeletal: Negative for back pain and joint pain.  Skin: Negative for rash.  Neurological: Negative for sensory change, speech change, focal weakness and headaches.  Endo/Heme/Allergies: Does not bruise/bleed easily.  Psychiatric/Behavioral: Negative for depression. The patient is not nervous/anxious.     DRUG ALLERGIES:  No Known Allergies  VITALS:  Blood pressure (!) 128/93, pulse 92, temperature 98.2 F (36.8 C), temperature source Oral, resp. rate (!) 23, height 5\' 9"  (1.753 m), weight 95.6 kg (210 lb 12.8 oz), SpO2 98 %.  PHYSICAL EXAMINATION:   Physical Exam  GENERAL:  60 y.o.-year-old patient lying in the bed with no acute distress.  EYES: Pupils equal, round, reactive to light and accommodation. No scleral icterus. Extraocular muscles intact.  HEENT: Head atraumatic, normocephalic. Oropharynx and nasopharynx clear.  NECK:  Supple, no jugular venous distention. No thyroid enlargement, no tenderness.  LUNGS: Normal breath sounds bilaterally, no wheezing, rales, rhonchi. No use of accessory muscles of respiration.  CARDIOVASCULAR: S1, S2 normal. No murmurs, rubs, or gallops.   ABDOMEN: Soft, nontender, nondistended. Bowel sounds present. No organomegaly or mass.  EXTREMITIES: No cyanosis, clubbing or edema b/l.    NEUROLOGIC: Cranial nerves II through XII are intact. No focal Motor or sensory deficits b/l.   PSYCHIATRIC: The patient is alert and oriented x 3.  SKIN: No obvious rash, lesion, or ulcer.   LABORATORY PANEL:   CBC  Recent Labs Lab 11/16/15 0605  WBC 7.3  HGB 14.2  HCT 40.7  PLT 139*   ------------------------------------------------------------------------------------------------------------------ Chemistries   Recent Labs Lab 11/16/15 0605  NA 139  K 4.3  CL 106  CO2 29  GLUCOSE 96  BUN 13  CREATININE 0.88  CALCIUM 8.5*  AST 25  ALT 22  ALKPHOS 93  BILITOT 1.0   ------------------------------------------------------------------------------------------------------------------  Cardiac Enzymes  Recent Labs Lab 11/16/15 0605  TROPONINI 0.33*   ------------------------------------------------------------------------------------------------------------------  RADIOLOGY:  Dg Chest 2 View  Result Date: 11/15/2015 CLINICAL DATA:  Chest discomfort EXAM: CHEST  2 VIEW COMPARISON:  None. FINDINGS: The heart size and mediastinal contours are within normal limits. Both lungs are clear. The visualized skeletal structures are unremarkable. IMPRESSION: No active cardiopulmonary disease. Electronically Signed   By: Dorise Bullion III M.D   On: 11/15/2015 15:23     ASSESSMENT AND PLAN:   * NSTEMI ASA, Statin, BB Check Echo. Cath later today. Appreciate cardiology input Now chest pain free Heparin drip  * DVT prophylaxis On heparin drip  All the records are reviewed and case discussed with Care Management/Social Workerr. Management plans discussed with the patient, family and they are in agreement.  CODE STATUS: FULL CODE  DVT Prophylaxis: SCDs  TOTAL TIME TAKING CARE OF THIS PATIENT: 35 minutes.  POSSIBLE D/C IN  1-2 DAYS, DEPENDING ON CLINICAL CONDITION.  Hillary Bow R M.D on 11/16/2015 at 2:29 PM  Between 7am to 6pm - Pager - [phone]  After 6pm go to [email or web address] - password [email or web address]  Office  [phone]  CC: Primary care physician; [facility]  Note: This dictation was prepared with Dragon dictation along with smaller phrase technology. Any transcriptional errors that result from this process are unintentional.

## 2015-11-16 NOTE — Progress Notes (Signed)
A&O. Independent. On IV fluids and heparin drip. Possible cardiac cath today.

## 2015-11-17 ENCOUNTER — Inpatient Hospital Stay (HOSPITAL_COMMUNITY)
Admission: AD | Admit: 2015-11-17 | Discharge: 2015-11-24 | DRG: 236 | Disposition: A | Discharge status: Home / Self Care | Payer: BLUE CROSS/BLUE SHIELD | Source: Other Acute Inpatient Hospital | Attending: Thoracic Surgery (Cardiothoracic Vascular Surgery) | Admitting: Thoracic Surgery (Cardiothoracic Vascular Surgery)

## 2015-11-17 ENCOUNTER — Encounter: Payer: Self-pay | Admitting: Cardiology

## 2015-11-17 ENCOUNTER — Inpatient Hospital Stay (HOSPITAL_COMMUNITY): Payer: BLUE CROSS/BLUE SHIELD

## 2015-11-17 ENCOUNTER — Other Ambulatory Visit: Payer: Self-pay | Admitting: *Deleted

## 2015-11-17 DIAGNOSIS — D62 Acute posthemorrhagic anemia: Secondary | ICD-10-CM | POA: Diagnosis not present

## 2015-11-17 DIAGNOSIS — R7989 Other specified abnormal findings of blood chemistry: Secondary | ICD-10-CM | POA: Diagnosis present

## 2015-11-17 DIAGNOSIS — Z01810 Encounter for preprocedural cardiovascular examination: Secondary | ICD-10-CM

## 2015-11-17 DIAGNOSIS — Z951 Presence of aortocoronary bypass graft: Secondary | ICD-10-CM

## 2015-11-17 DIAGNOSIS — H409 Unspecified glaucoma: Secondary | ICD-10-CM | POA: Diagnosis present

## 2015-11-17 DIAGNOSIS — I4891 Unspecified atrial fibrillation: Secondary | ICD-10-CM | POA: Diagnosis not present

## 2015-11-17 DIAGNOSIS — I081 Rheumatic disorders of both mitral and tricuspid valves: Secondary | ICD-10-CM | POA: Diagnosis present

## 2015-11-17 DIAGNOSIS — I2510 Atherosclerotic heart disease of native coronary artery without angina pectoris: Secondary | ICD-10-CM | POA: Diagnosis present

## 2015-11-17 DIAGNOSIS — Z7982 Long term (current) use of aspirin: Secondary | ICD-10-CM

## 2015-11-17 DIAGNOSIS — J9811 Atelectasis: Secondary | ICD-10-CM | POA: Diagnosis not present

## 2015-11-17 DIAGNOSIS — I214 Non-ST elevation (NSTEMI) myocardial infarction: Secondary | ICD-10-CM | POA: Diagnosis present

## 2015-11-17 DIAGNOSIS — E785 Hyperlipidemia, unspecified: Secondary | ICD-10-CM | POA: Diagnosis present

## 2015-11-17 DIAGNOSIS — I10 Essential (primary) hypertension: Secondary | ICD-10-CM | POA: Diagnosis present

## 2015-11-17 DIAGNOSIS — I200 Unstable angina: Secondary | ICD-10-CM | POA: Diagnosis present

## 2015-11-17 DIAGNOSIS — E8770 Fluid overload, unspecified: Secondary | ICD-10-CM | POA: Diagnosis not present

## 2015-11-17 DIAGNOSIS — R778 Other specified abnormalities of plasma proteins: Secondary | ICD-10-CM | POA: Diagnosis present

## 2015-11-17 DIAGNOSIS — I25110 Atherosclerotic heart disease of native coronary artery with unstable angina pectoris: Secondary | ICD-10-CM | POA: Diagnosis present

## 2015-11-17 DIAGNOSIS — Z8249 Family history of ischemic heart disease and other diseases of the circulatory system: Secondary | ICD-10-CM | POA: Diagnosis not present

## 2015-11-17 HISTORY — DX: Presence of aortocoronary bypass graft: Z95.1

## 2015-11-17 HISTORY — DX: Non-ST elevation (NSTEMI) myocardial infarction: I21.4

## 2015-11-17 HISTORY — DX: Atherosclerotic heart disease of native coronary artery without angina pectoris: I25.10

## 2015-11-17 HISTORY — DX: Essential (primary) hypertension: I10

## 2015-11-17 LAB — PULMONARY FUNCTION TEST
DL/VA % pred: 92 %
DL/VA: 4.23 ml/min/mmHg/L
DLCO unc % pred: 83 %
DLCO unc: 25.98 ml/min/mmHg
FEF 25-75 PRE: 3.06 L/s
FEF 25-75 Post: 3.78 L/sec
FEF2575-%CHANGE-POST: 23 %
FEF2575-%Pred-Post: 135 %
FEF2575-%Pred-Pre: 109 %
FEV1-%Change-Post: 7 %
FEV1-%PRED-POST: 102 %
FEV1-%Pred-Pre: 95 %
FEV1-POST: 3.49 L
FEV1-PRE: 3.26 L
FEV1FVC-%CHANGE-POST: 7 %
FEV1FVC-%Pred-Pre: 100 %
FEV6-%CHANGE-POST: 0 %
FEV6-%PRED-PRE: 99 %
FEV6-%Pred-Post: 99 %
FEV6-POST: 4.3 L
FEV6-PRE: 4.28 L
FEV6FVC-%Change-Post: 0 %
FEV6FVC-%PRED-PRE: 104 %
FEV6FVC-%Pred-Post: 105 %
FVC-%CHANGE-POST: 0 %
FVC-%Pred-Post: 94 %
FVC-%Pred-Pre: 95 %
FVC-POST: 4.3 L
FVC-Pre: 4.32 L
POST FEV6/FVC RATIO: 100 %
Post FEV1/FVC ratio: 81 %
Pre FEV1/FVC ratio: 76 %
Pre FEV6/FVC Ratio: 99 %
RV % PRED: 201 %
RV: 4.5 L
TLC % pred: 131 %
TLC: 8.92 L

## 2015-11-17 LAB — URINALYSIS, ROUTINE W REFLEX MICROSCOPIC
Bilirubin Urine: NEGATIVE
GLUCOSE, UA: NEGATIVE mg/dL
Hgb urine dipstick: NEGATIVE
Ketones, ur: 15 mg/dL — AB
LEUKOCYTES UA: NEGATIVE
Nitrite: NEGATIVE
PH: 6.5 (ref 5.0–8.0)
PROTEIN: NEGATIVE mg/dL
SPECIFIC GRAVITY, URINE: 1.01 (ref 1.005–1.030)

## 2015-11-17 LAB — CBC
HCT: 42.2 % (ref 40.0–52.0)
Hemoglobin: 14.6 g/dL (ref 13.0–18.0)
MCH: 32.1 pg (ref 26.0–34.0)
MCHC: 34.7 g/dL (ref 32.0–36.0)
MCV: 92.4 fL (ref 80.0–100.0)
PLATELETS: 139 10*3/uL — AB (ref 150–440)
RBC: 4.56 MIL/uL (ref 4.40–5.90)
RDW: 13.1 % (ref 11.5–14.5)
WBC: 8.8 10*3/uL (ref 3.8–10.6)

## 2015-11-17 LAB — HEPARIN LEVEL (UNFRACTIONATED): HEPARIN UNFRACTIONATED: 0.54 [IU]/mL (ref 0.30–0.70)

## 2015-11-17 MED ORDER — ONDANSETRON HCL 4 MG/2ML IJ SOLN
4.0000 mg | Freq: Four times a day (QID) | INTRAMUSCULAR | Status: DC | PRN
Start: 2015-11-17 — End: 2015-11-19

## 2015-11-17 MED ORDER — SODIUM CHLORIDE 0.9% FLUSH
3.0000 mL | Freq: Two times a day (BID) | INTRAVENOUS | Status: DC
  Administered 2015-11-17 – 2015-11-18 (×3): 3 mL via INTRAVENOUS

## 2015-11-17 MED ORDER — ACETAMINOPHEN 325 MG PO TABS: 650.0000 mg | ORAL_TABLET | Freq: Four times a day (QID) | ORAL | Status: DC | PRN

## 2015-11-17 MED ORDER — ASPIRIN EC 81 MG PO TBEC
81.0000 mg | DELAYED_RELEASE_TABLET | Freq: Every day | ORAL | Status: DC
Start: 2015-11-17 — End: 2015-11-19
  Administered 2015-11-18 – 2015-11-19 (×2): 81 mg via ORAL
  Filled 2015-11-17 (×2): qty 1

## 2015-11-17 MED ORDER — TRAMADOL HCL 50 MG PO TABS: 50.0000 mg | ORAL_TABLET | Freq: Four times a day (QID) | ORAL | Status: DC | PRN

## 2015-11-17 MED ORDER — ALBUTEROL SULFATE (2.5 MG/3ML) 0.083% IN NEBU
2.5000 mg | INHALATION_SOLUTION | Freq: Once | RESPIRATORY_TRACT | Status: AC
  Administered 2015-11-17: 2.5 mg via RESPIRATORY_TRACT

## 2015-11-17 MED ORDER — METOPROLOL TARTRATE 12.5 MG HALF TABLET: 12.5000 mg | ORAL_TABLET | Freq: Two times a day (BID) | ORAL | Status: DC

## 2015-11-17 MED ORDER — NITROGLYCERIN 0.4 MG SL SUBL: 0.4000 mg | SUBLINGUAL_TABLET | SUBLINGUAL | Status: DC | PRN

## 2015-11-17 MED ORDER — ACETAMINOPHEN 650 MG RE SUPP: 650.0000 mg | Freq: Four times a day (QID) | RECTAL | Status: DC | PRN

## 2015-11-17 MED ORDER — SENNOSIDES-DOCUSATE SODIUM 8.6-50 MG PO TABS: 1.0000 | ORAL_TABLET | Freq: Every evening | ORAL | Status: DC | PRN

## 2015-11-17 MED ORDER — NITROGLYCERIN 2 % TD OINT
1.0000 [in_us] | TOPICAL_OINTMENT | Freq: Four times a day (QID) | TRANSDERMAL | Status: DC
  Administered 2015-11-17 – 2015-11-19 (×6): 1 [in_us] via TOPICAL
  Filled 2015-11-17 (×2): qty 30

## 2015-11-17 MED ORDER — SODIUM CHLORIDE 0.9 % IV SOLN: 250.0000 mL | INTRAVENOUS | Status: DC | PRN

## 2015-11-17 MED ORDER — ADULT MULTIVITAMIN W/MINERALS CH
1.0000 | ORAL_TABLET | Freq: Every day | ORAL | Status: DC
  Administered 2015-11-18: 1 via ORAL
  Filled 2015-11-17: qty 1

## 2015-11-17 MED ORDER — SODIUM CHLORIDE 0.9% FLUSH: 3.0000 mL | INTRAVENOUS | Status: DC | PRN

## 2015-11-17 MED ORDER — ATORVASTATIN CALCIUM 40 MG PO TABS
40.0000 mg | ORAL_TABLET | Freq: Every day | ORAL | Status: DC
  Administered 2015-11-17 – 2015-11-18 (×2): 40 mg via ORAL
  Filled 2015-11-17 (×2): qty 1

## 2015-11-17 MED ORDER — METOPROLOL TARTRATE 25 MG PO TABS
25.0000 mg | ORAL_TABLET | Freq: Two times a day (BID) | ORAL | Status: DC
  Administered 2015-11-17 – 2015-11-19 (×4): 25 mg via ORAL
  Filled 2015-11-17 (×4): qty 1

## 2015-11-17 MED ORDER — HEPARIN (PORCINE) IN NACL 100-0.45 UNIT/ML-% IJ SOLN
1250.0000 [IU]/h | INTRAMUSCULAR | Status: DC
  Administered 2015-11-17 (×2): 1150 [IU]/h via INTRAVENOUS
  Filled 2015-11-17 (×2): qty 250

## 2015-11-17 MED ORDER — ONDANSETRON HCL 4 MG PO TABS: 4.0000 mg | ORAL_TABLET | Freq: Four times a day (QID) | ORAL | Status: DC | PRN

## 2015-11-17 NOTE — Progress Notes (Signed)
Pre-op Cardiac Surgery  Carotid Findings:  There is no obvious evidence of hemodynamically significant internal carotid artery stenosis bilaterally. Vertebral arteries are patent with antegrade flow.  Upper Extremity Right Left  Brachial Pressures Triphasic 155-Triphasic  Radial Waveforms Triphasic Triphasic  Ulnar Waveforms Triphasic Triphasic  Palmar Arch (Allen's Test) Within normal limits Within normal limits.    Lower  Extremity Right Left  Dorsalis Pedis Triphasic Triphasic  Posterior Tibial Triphasic Triphasic   11/17/2015 4:07 PM Maudry Mayhew, BS, RVT, RDCS, RDMS

## 2015-11-17 NOTE — Progress Notes (Signed)
[facility] currently here to transport pt to [facility].  Tele removed and wife at bedside.

## 2015-11-17 NOTE — Progress Notes (Signed)
Pt transferred to 2w17 via [facility]. VSS. Hooked up to tele monitor. MD paged. Call bell and phone within reach. Will continue to monitor.

## 2015-11-17 NOTE — H&P (Signed)
[address]       [place] [address]             [phone]        Kenshawn Creveling [place] Medical Record [id number] Date of Birth: 02/25/1956  Referring: Dr. Isaias Cowman  Primary Care: [facility]  Chief Complaint:   S/p NSTEMI, coronary artery disease   History of Present Illness:     This is a 60 year old male  who was transferred from [facility] for consideration of coronary artery bypass grafting surgery. The patient recently moved to [place] from [place]. He reports two-month history of chest pain. He developed substernal chest tightness with radiation to his left arm with exertion, with such activities as lifting furniture, walking up steps or up hill. He was in his usual state of health until the night of 11/16/2015 when he developed substernal chest pain while laying in bed. He initially presented to [facility] emergency room where ECG was nondiagnostic. Troponin was elevated to 0.58. He ruled in for a NSTEMI. He underwent a cardiac catheterization on 08/21 by Dr. Saralyn Pilar. Results showed a proximal 99% stenosis of the LAD, ostial circumflex to proximal circumflex 99% stenosis, and a 60% ostial left main stenosis. Currently, he denies chest pain or shortness of breath.   Current Activity/ Functional Status: Patient is independent with mobility/ambulation, transfers, ADL's, IADL's.   Zubrod Score: At the time of surgery this patient's most appropriate activity status/level should be described as: []     0    Normal activity, no symptoms []     1    Restricted in physical strenuous activity but ambulatory, able to do out light work [x]     2    Ambulatory and capable of self care, unable to do work activities, up and about                 more than 50%  Of the time                            []     3    Only limited self care, in bed greater than 50% of waking hours []     4    Completely disabled, no self care,  confined to bed or chair []     5    Moribund  Past Medical History:  Diagnosis Date  . Glaucoma     Past Surgical History:  Procedure Laterality Date  . CARDIAC CATHETERIZATION N/A 11/16/2015   Procedure: Left Heart Cath and Coronary Angiography;  Surgeon: Isaias Cowman, MD;  Location: [place] [facility];  Service: Cardiovascular;  Laterality: N/A;  Arthroscopic right knee surgery for meniscal injury Broken nose many years ago  Social History   Social History  . Marital status: Married    Spouse name: N/A  . Number of children: 2 daughters (one in [employer] school and one in graduate school at [employer])  . Years of education: N/A   Occupational History  . Retired. 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   Social History Main Topics  . Smoking status: Never Smoker  . Smokeless tobacco: Never Used  . Alcohol use 1.2 oz/week    2 Cans of beer per week  . Drug use: No  . Sexual activity: Not on file    Allergies: No Known Allergies   Prescriptions Prior to Admission  Medication Sig  Dispense Refill Last Dose  . aspirin 81 MG chewable tablet Chew 81 mg by mouth every morning.   11/15/2015  . Cholecalciferol (VITAMIN D3) 2000 units capsule Take 4,000 Units by mouth every morning.    11/15/2015  . glucosamine-chondroitin 500-400 MG tablet Take 1 tablet by mouth every morning.   11/15/2015  . latanoprost (XALATAN) 0.005 % ophthalmic solution Place 1 drop into the right eye at bedtime.   11/14/2015  . Multiple Vitamins-Minerals (PX ADVANCED FORMULA MULTIVITS) TABS Take 1 tablet by mouth every morning.   11/15/2015    Family History  Problem Relation Age of Onset  . CAD Brother     Review of Systems:      Cardiac Review of Systems: Y or N  Chest Pain [ Y  ] Exertional SOB  [ N ]  Orthopnea [ N ]   Pedal Edema Aqua.Slicker   ]    Palpitations [ N ] Syncope  [ N ]   Presyncope [  N ]  General Review of Systems: [Y] = yes [ N ]=no Constitional:nausea Aqua.Slicker  ]; night sweats [ N ];  fever [N  ]; or chills [ N]                                                                Eye :Amaurosis fugax[ N ] Resp: cough [ N ];  wheezing[N  ];  hemoptysis[ N ] GI: vomiting[N  ];  dysphagia[  N]; melena[ N ];  hematochezia [ N ];   GU:  hematuria[  N];   dysuria [ N ]             Skin: rash, swelling[ N ];, hair loss[  ];  peripheral edema[ N ];  or itching[ N ]; Musculosketetal: myalgias[N  ];  joint swelling[ N ]  Heme/Lymph: bruising[N  ];  bleeding[N  ];  anemia[  N];  Neuro: TIA[ N ];  stroke[N  ];  vertigo[ N ];  seizures[ N ];   paresthesias[ N ];  difficulty walking[ N ];  Psych:depression[N  ]; anxiety[ N ];  Endocrine: diabetes[ N ];  thyroid dysfunction[N  ];    Physical Exam: BP (!) 130/92   Pulse (!) 106   Temp 98.7 F (37.1 C) (Oral)   Resp 19   Ht 5\' 10"  (1.778 m)   Wt 210 lb 1.6 oz (95.3 kg)   SpO2 98%   BMI 30.15 kg/m    General appearance: alert, cooperative and no distress Head: Normocephalic, without obvious abnormality, atraumatic Neck: no carotid bruit, no JVD and supple, symmetrical, trachea midline Resp: clear to auscultation bilaterally Cardio: regular rate and rhythm, S1, S2 normal, no murmur, click, rub or gallop GI: soft, non-tender; bowel sounds normal; no masses,  no organomegaly Extremities: No LE edema, palpable DP and PT bilaterally Neurologic: Grossly normal  Diagnostic Studies & Laboratory data:     Recent Radiology Findings:   Dg Chest 2 View  Result Date: 11/15/2015 CLINICAL DATA:  Chest discomfort EXAM: CHEST  2 VIEW COMPARISON:  None. FINDINGS: The heart size and mediastinal contours are within normal limits. Both lungs are clear. The visualized skeletal structures are unremarkable. IMPRESSION: No active cardiopulmonary disease. Electronically Signed   By: Dorise Bullion III  M.D   On: 11/15/2015 15:23     I have independently reviewed the above radiologic studies.  Recent Lab Findings: Lab Results  Component Value Date    WBC 8.8 11/17/2015   HGB 14.6 11/17/2015   HCT 42.2 11/17/2015   PLT 139 (L) 11/17/2015   GLUCOSE 96 11/16/2015   CHOL 161 11/16/2015   TRIG 72 11/16/2015   HDL 49 11/16/2015   LDLCALC 98 11/16/2015   ALT 22 11/16/2015   AST 25 11/16/2015   NA 139 11/16/2015   K 4.3 11/16/2015   CL 106 11/16/2015   CREATININE 0.88 11/16/2015   BUN 13 11/16/2015   CO2 29 11/16/2015   INR 1.07 11/15/2015      Transthoracic Echocardiography   Patient:    Taylan, Stitts MR #:       [id number] Study Date: 11/16/2015 Gender:     M Age:        62 Height:     175.3 cm Weight:     95.6 kg BSA:        2.19 m^2 Pt. Status: Room:       243A    ADMITTING    Si Raider  REFERRING    Idelle Crouch  ATTENDING    Hillary Bow R  SONOGRAPHER  Sherrie Sport RDCS  PERFORMING   [facility]   cc:   ------------------------------------------------------------------- LV EF: 35% -   40%   ------------------------------------------------------------------- Indications:      Chest pain 786.50.   ------------------------------------------------------------------- History:   PMH:  Glaucoma.   ------------------------------------------------------------------- Study Conclusions   - Left ventricle: The cavity size was normal. Systolic function was   moderately reduced. The estimated ejection fraction was in the   range of 35% to 40%. Akinesis of the inferoseptal myocardium.   Hypokinesis of the apical myocardium. Akinesis of the inferior   myocardium. - Aortic valve: Valve area (Vmax): 3.22 cm^2. - Mitral valve: There was mild regurgitation. - Left atrium: The atrium was mildly dilated.   ------------------------------------------------------------------- Study data:   Study status:  Routine.  Procedure:  The patient reported no pain pre or post test. Transthoracic echocardiography. Image quality was adequate.           Transthoracic echocardiography.  M-mode, complete 2D, spectral Doppler, and color Doppler.  Birthdate:  Patient birthdate: [birth year].  Age:  Patient is 60 yr old.  Sex:  Gender: male.    BMI: 31.1 kg/m^2.  Blood pressure:     128/81  Patient status:  Inpatient.  Study date: Study date: 11/16/2015. Study time: 09:17 AM.  Location:  Bedside.   -------------------------------------------------------------------   ------------------------------------------------------------------- Left ventricle:  The cavity size was normal. Systolic function was moderately reduced. The estimated ejection fraction was in the range of 35% to 40%.  Regional wall motion abnormalities: Akinesis of the inferoseptal myocardium.  Hypokinesis of the apical myocardium.  Akinesis of the inferior myocardium.   ------------------------------------------------------------------- Aortic valve:   Structurally normal valve.   Cusp separation was normal.  Doppler:  Transvalvular velocity was within the normal range. There was no stenosis. There was no regurgitation.    Peak velocity ratio of LVOT to aortic valve: 0.77. Valve area (Vmax): 3.22 cm^2. Indexed valve area (Vmax): 1.47 cm^2/m^2.    Peak gradient (S): 4 mm Hg.   ------------------------------------------------------------------- Aorta:  The aorta was normal, not dilated, and non-diseased.   ------------------------------------------------------------------- Mitral valve:   Doppler:  There was  mild regurgitation.   ------------------------------------------------------------------- Left atrium:  The atrium was mildly dilated.   ------------------------------------------------------------------- Right ventricle:  The cavity size was normal. Wall thickness was normal. Systolic function was normal.   ------------------------------------------------------------------- Tricuspid valve:   Doppler:  There was mild regurgitation.    ------------------------------------------------------------------- Right atrium:  The atrium was normal in size.   ------------------------------------------------------------------- Post procedure conclusions Ascending Aorta:   - The aorta was normal, not dilated, and non-diseased.   ------------------------------------------------------------------- Measurements    Left ventricle                            Value          Reference  LV ID, ED, PLAX chordal                   45.6  mm       43 - 52  LV ID, ES, PLAX chordal                   31.4  mm       23 - 38  LV fx shortening, PLAX chordal            31    %        >=29  LV PW thickness, ED                       10.8  mm       ---------  IVS/LV PW ratio, ED                       1.25           <=1.3  LV e&', lateral                            6.42  cm/s     ---------  LV E/e&', lateral                          9.07           ---------  LV e&', medial                             6.42  cm/s     ---------  LV E/e&', medial                           9.07           ---------  LV e&', average                            6.42  cm/s     ---------  LV E/e&', average                          9.07           ---------    Ventricular septum                        Value          Reference  IVS thickness, ED  13.5  mm       ---------    LVOT                                      Value          Reference  LVOT ID, S                                23    mm       ---------  LVOT area                                 4.15  cm^2     ---------  LVOT peak velocity, S                     73.7  cm/s     ---------    Aortic valve                              Value          Reference  Aortic valve peak velocity, S             95.1  cm/s     ---------  Aortic peak gradient, S                   4     mm Hg    ---------  Velocity ratio, peak, LVOT/AV             0.77           ---------  Aortic valve area, peak velocity           3.22  cm^2     ---------  Aortic valve area/bsa, peak               1.47  cm^2/m^2 ---------  velocity    Aorta                                     Value          Reference  Aortic root ID, ED                        31    mm       ---------    Left atrium                               Value          Reference  LA ID, A-P, ES                            34    mm       ---------  LA ID/bsa, A-P                            1.56  cm/m^2   <=2.2  LA volume, S  32.7  ml       ---------  LA volume/bsa, S                          15    ml/m^2   ---------  LA volume, ES, 1-p A4C                    27.7  ml       ---------  LA volume/bsa, ES, 1-p A4C                12.7  ml/m^2   ---------  LA volume, ES, 1-p A2C                    38.9  ml       ---------  LA volume/bsa, ES, 1-p A2C                17.8  ml/m^2   ---------    Mitral valve                              Value          Reference  Mitral E-wave peak velocity               58.2  cm/s     ---------  Mitral A-wave peak velocity               97.5  cm/s     ---------  Mitral deceleration time                  206   ms       150 - 230  Mitral E/A ratio, peak                    0.6            ---------    Right ventricle                           Value          Reference  RV ID, ED, PLAX                           30.5  mm       19 - 38  TAPSE                                     18.2  mm       ---------    Pulmonic valve                            Value          Reference  Pulmonic valve peak velocity, S           64.5  cm/s     ---------   Legend: (L)  and  (H)  mark values outside specified reference range.   ------------------------------------------------------------------- Prepared and Electronically Authenticated by   Serafina Royals, MD 2017-08-21T12:32:46      Left Heart Cath and Coronary Angiography      Prox LAD lesion, 99 %stenosed.  Ost LM lesion, 60 %stenosed.  Ost Cx  to Prox Cx  lesion, 95 %stenosed.   1. Two-vessel coronary artery disease with 5060% stenosis distal left main, 99% stenosis proximal LAD, and 95% stenosis ostial left circumflex 2. Normal left ventricular function  Recommendations  1. CABG   Procedural Details/Technique   Technical Details The right groin was prepped and draped in the usual sterile manner. A 5 French sheath was introduced into right femoral artery. Left ventriculography was performed with a pigtail catheter. Coronary angiography was performed with standard left and right Judkins diagnostic catheters. The right femoral artery was closed with a Mynx closure device. The patient received 1 mg of Versed, 25 g of fentanyl, and 230 cc of contrast. There were no procedural complications.   Estimated blood loss <50 mL. .    Coronary Findings   Dominance: Right  Left Main  Ost LM lesion, 60% stenosed. The lesion is tubular.  Left Anterior Descending  Prox LAD lesion, 99% stenosed. The lesion is tubular.  Left Circumflex  Ost Cx to Prox Cx lesion, 95% stenosed. The lesion is tubular.  Wall Motion              Coronary Diagrams   Diagnostic Diagram           Assessment / Plan:      1. S/p NSTEMI. Patient with coronary artery disease- Dr. Roxy Manns to evaluate for coronary artery bypass grafting surgery, which will likely be done on Thursday.  On Heparin drip so will stop Heparin on call to OR. Patient on Nitro bid patch every 6 hours, which will continue. Will also continue Lopressor 12.5 mg bid. Will discuss with Dr. Roxy Manns if should continue serial Troponin's 2. Hyperlipidemia-Will continue Lipitor as started at [facility]. Patient wishes to discuss possible use of Pravastatin as is worried about myalgias. 3. Continue with pre op work up for CABG-labs, PFTs, carotid duplex. 4. Will transfer to stepdown for close monitoring.   I  spent 30 minutes counseling the patient face to face and 50% or more the  time was spent in counseling  and coordination of care. The total time spent in the appointment was 60 minutes.    Lars Pinks PA-C 11/17/2015 12:38 PM    I have seen and examined the patient and agree with the assessment and plan as outlined above.  Patient is a 60 yo previously healthy male with no previous h/o CAD and few risk factors other than newly discovered hypertension and borderline hyperlipidemia.  He presents with recent onset of classical symptoms of exertional angina approximately 6 weeks ago.  He has experienced a total of 5 or 6 episodes of chest discomfort during that period of time, always in the setting of significant physical exertion.  Three days ago he awoke from his sleep having chest discomfort.  The pain resolved within 30 minutes.   He was admitted to [facility] where Troponin levels were mildly elevated.  He has not had any further episodes of chest pain since he was admitted.  He otherwise feels well.  He specifically denies any h/o exertional SOB, PND, orthopnea, palpitations, dizzy spells or syncope.  He has noted that he gets tired more easily than he used to.  I have personally reviewed the patient's echocardiogram and diagnostic cardiac catheterization performed at [facility].  I agree w/ the cath report as summarized above.  I agree that Mr Hainsworth would best be treated with surgical revascularization.  I have reviewed the indications, risks, and potential benefits of coronary artery bypass grafting with  the patient and his wife.  Alternative treatment strategies have been discussed, including the relative risks, benefits and long term prognosis associated with medical therapy, percutaneous coronary intervention, and surgical revascularization.  The patient understands and accepts all potential associated risks of surgery including but not limited to risk of death, stroke or other neurologic complication, myocardial infarction, congestive heart failure, respiratory failure, renal failure, bleeding  requiring blood transfusion and/or reexploration, aortic dissection or other major vascular complication, arrhythmia, heart block or bradycardia requiring permanent pacemaker, pneumonia, pleural effusion, wound infection, pulmonary embolus or other thromboembolic complication, chronic pain or other delayed complications related to median sternotomy, or the late recurrence of symptomatic ischemic heart disease and/or congestive heart failure.  The importance of long term risk modification have been emphasized.  All questions answered.  We tentatively plan for surgery on Thursday 11/19/2015   I spent in excess of 60 minutes during the conduct of this hospital encounter and >50% of this time involved direct face-to-face encounter with the patient for counseling and/or coordination of their care.   Rexene Alberts, MD 11/17/2015 5:21 PM

## 2015-11-17 NOTE — Progress Notes (Signed)
RN informed that pt would be transferring to [facility] RM[facility]. Pt and wife made aware of transfer and room assignment. Report on pt given to Catron,  receiving floor RN. [facility] set to arrive in 20-30mins for transport. Heparin gtt and tele remaining in place awaiting transports arrival. Nursing will continue to assess.

## 2015-11-17 NOTE — Progress Notes (Signed)
ANTICOAGULATION CONSULT NOTE - Initial Consult  Pharmacy Consult for heparin bolus and drip Indication: chest pain/ACS  No Known Allergies  Patient Measurements: Height: 5\' 9"  (175.3 cm) Weight: 210 lb 3.2 oz (95.3 kg) IBW/kg (Calculated) : 70.7 Heparin Dosing Weight: 89.8  Vital Signs: Temp: 98.3 F (36.8 C) (08/22 0510) Temp Source: Oral (08/22 0510) BP: 136/88 (08/22 0510) Pulse Rate: 102 (08/22 0510)  Labs:  Recent Labs  11/15/15 1412 11/15/15 1753 11/15/15 2348 11/16/15 0605 11/17/15 0525  HGB 15.4  --   --  14.2 14.6  HCT 44.0  --   --  40.7 42.2  PLT 154  --   --  139* 139*  APTT 31  --   --   --   --   LABPROT 13.9  --   --   --   --   INR 1.07  --   --   --   --   HEPARINUNFRC  --   --  0.46 0.36 0.54  CREATININE 0.96  --   --  0.88  --   TROPONINI 0.58* 0.47* 0.48* 0.33*  --     Estimated Creatinine Clearance: 100.4 mL/min (by C-G formula based on SCr of 0.88 mg/dL).   Medical History: Past Medical History:  Diagnosis Date  . Glaucoma     Medications:  Scheduled:  . aspirin EC  81 mg Oral Daily  . atorvastatin  40 mg Oral q1800  . docusate sodium  100 mg Oral BID  . latanoprost  1 drop Right Eye QHS  . metoprolol succinate  25 mg Oral Daily  . nitroGLYCERIN  1 inch Topical Q6H  . sodium chloride flush  3 mL Intravenous Q12H  . sodium chloride flush  3 mL Intravenous Q12H   Infusions:  . sodium chloride 75 mL/hr at 11/16/15 [id number]  . heparin 1,150 Units/hr (11/16/15 0731)    Assessment: Pharmacy has been consulted to dose heparin drip and bolus for this 74 yoM. Patient reports taking no anticoagulants prior to admission. Baseline labs were ordered. HL ordered for 6 hours after start of heparin (08/20 @ 2300). CBC order with 08/21 AM labs.   Goal of Therapy:  Heparin level 0.3-0.7 units/ml Monitor platelets by anticoagulation protocol: Yes   Plan:  Give 4000 units bolus x 1 Start heparin infusion at 1150 units/hr Check anti-Xa level in  6 hours and daily while on heparin Continue to monitor H&H and platelets   8/20:  HL @ 23:48 = 0.46 Will continue this pt on current rate and recheck HL in 6 hrs.   8/21: HL @ 0600 = 0.36 Will continue this pt on current rate and recheck HL on 8/22 with AM labs.   8/22 AM heparin level 0.54. Continue current regimen. Recheck heparin level and CBC with tomorrow AM labs.  Felcia Huebert S 11/17/2015 5:55 AM

## 2015-11-17 NOTE — Progress Notes (Signed)
Report called to lexie on 2H.

## 2015-11-17 NOTE — Progress Notes (Signed)
ANTICOAGULATION CONSULT NOTE   Pharmacy Consult for Heparin Indication: chest pain/ACS  No Known Allergies  Patient Measurements: Height: 5\' 10"  (177.8 cm) Weight: 210 lb 1.6 oz (95.3 kg) IBW/kg (Calculated) : 73 Heparin Dosing Weight: 89.8  Vital Signs: Temp: 98.7 F (37.1 C) (08/22 1157) Temp Source: Oral (08/22 1157) BP: 130/92 (08/22 1157) Pulse Rate: 106 (08/22 1157)  Labs:  Recent Labs  11/15/15 1412 11/15/15 1753 11/15/15 2348 11/16/15 0605 11/17/15 0525  HGB 15.4  --   --  14.2 14.6  HCT 44.0  --   --  40.7 42.2  PLT 154  --   --  139* 139*  APTT 31  --   --   --   --   LABPROT 13.9  --   --   --   --   INR 1.07  --   --   --   --   HEPARINUNFRC  --   --  0.46 0.36 0.54  CREATININE 0.96  --   --  0.88  --   TROPONINI 0.58* 0.47* 0.48* 0.33*  --     Estimated Creatinine Clearance: 102.1 mL/min (by C-G formula based on SCr of 0.88 mg/dL).   Medical History: Past Medical History:  Diagnosis Date  . Glaucoma     Medications:  Scheduled:  . aspirin EC  81 mg Oral Daily  . atorvastatin  40 mg Oral q1800  . metoprolol tartrate  12.5 mg Oral BID  . multivitamin with minerals  1 tablet Oral Daily  . nitroGLYCERIN  1 inch Topical Q6H  . sodium chloride flush  3 mL Intravenous Q12H   Infusions:  . heparin      Assessment: 60yo male transfers over from [facility] on heparin for consideration of CABG. Pt has been on heparin since 8/20 and has been therapeutic on heparin 1150 units/hr. Heparin was continued throughout transfer and remains correct on my inspection. Will continue current regimen and recheck levels in the morning. 8/21 AM labs.  Goal of Therapy:  Heparin level 0.3-0.7 units/ml Monitor platelets by anticoagulation protocol: Yes   Plan:  Heparin 1150 units/hr Daily HL Monitor s/sx of bleeding   Andrey Cota. Diona Foley, PharmD, BCPS Clinical Pharmacist Pager [phone] 11/17/2015 1:58 PM

## 2015-11-18 ENCOUNTER — Encounter (HOSPITAL_COMMUNITY): Payer: BLUE CROSS/BLUE SHIELD

## 2015-11-18 LAB — BLOOD GAS, ARTERIAL
ACID-BASE EXCESS: 1.4 mmol/L (ref 0.0–2.0)
BICARBONATE: 25.3 meq/L — AB (ref 20.0–24.0)
Drawn by: 10006
FIO2: 21
O2 SAT: 94.2 %
PATIENT TEMPERATURE: 98.6
PO2 ART: 71 mmHg — AB (ref 80.0–100.0)
TCO2: 26.5 mmol/L (ref 0–100)
pCO2 arterial: 38.8 mmHg (ref 35.0–45.0)
pH, Arterial: 7.43 (ref 7.350–7.450)

## 2015-11-18 LAB — VAS US DOPPLER PRE CABG
LCCADDIAS: -23 cm/s
LCCADSYS: -97 cm/s
LCCAPDIAS: 22 cm/s
LCCAPSYS: 83 cm/s
LEFT VERTEBRAL DIAS: 17 cm/s
LICADDIAS: -35 cm/s
Left ICA dist sys: -94 cm/s
Left ICA prox dias: -19 cm/s
Left ICA prox sys: -61 cm/s
RCCADSYS: -83 cm/s
RCCAPDIAS: 14 cm/s
RIGHT ECA DIAS: -12 cm/s
Right CCA prox sys: 76 cm/s

## 2015-11-18 LAB — COMPREHENSIVE METABOLIC PANEL
ALT: 23 U/L (ref 17–63)
AST: 26 U/L (ref 15–41)
Albumin: 3.6 g/dL (ref 3.5–5.0)
Alkaline Phosphatase: 97 U/L (ref 38–126)
Anion gap: 5 (ref 5–15)
BILIRUBIN TOTAL: 1 mg/dL (ref 0.3–1.2)
BUN: 8 mg/dL (ref 6–20)
CHLORIDE: 107 mmol/L (ref 101–111)
CO2: 26 mmol/L (ref 22–32)
Calcium: 8.6 mg/dL — ABNORMAL LOW (ref 8.9–10.3)
Creatinine, Ser: 0.88 mg/dL (ref 0.61–1.24)
Glucose, Bld: 110 mg/dL — ABNORMAL HIGH (ref 65–99)
POTASSIUM: 3.6 mmol/L (ref 3.5–5.1)
Sodium: 138 mmol/L (ref 135–145)
TOTAL PROTEIN: 6.2 g/dL — AB (ref 6.5–8.1)

## 2015-11-18 LAB — HEPARIN LEVEL (UNFRACTIONATED)
HEPARIN UNFRACTIONATED: 0.29 [IU]/mL — AB (ref 0.30–0.70)
HEPARIN UNFRACTIONATED: 0.36 [IU]/mL (ref 0.30–0.70)

## 2015-11-18 LAB — TYPE AND SCREEN
ABO/RH(D): O NEG
ANTIBODY SCREEN: NEGATIVE

## 2015-11-18 LAB — CBC
HCT: 41.3 % (ref 39.0–52.0)
Hemoglobin: 14.1 g/dL (ref 13.0–17.0)
MCH: 31.5 pg (ref 26.0–34.0)
MCHC: 34.1 g/dL (ref 30.0–36.0)
MCV: 92.2 fL (ref 78.0–100.0)
PLATELETS: 158 10*3/uL (ref 150–400)
RBC: 4.48 MIL/uL (ref 4.22–5.81)
RDW: 12.8 % (ref 11.5–15.5)
WBC: 7.7 10*3/uL (ref 4.0–10.5)

## 2015-11-18 LAB — SURGICAL PCR SCREEN
MRSA, PCR: NEGATIVE
Staphylococcus aureus: NEGATIVE

## 2015-11-18 LAB — PROTIME-INR
INR: 1.13
Prothrombin Time: 14.5 seconds (ref 11.4–15.2)

## 2015-11-18 LAB — APTT: APTT: 68 s — AB (ref 24–36)

## 2015-11-18 LAB — ABO/RH: ABO/RH(D): O NEG

## 2015-11-18 MED ORDER — BISACODYL 5 MG PO TBEC: 5.0000 mg | DELAYED_RELEASE_TABLET | Freq: Once | ORAL | Status: DC

## 2015-11-18 MED ORDER — SODIUM CHLORIDE 0.9 % IV SOLN
INTRAVENOUS | Status: AC
  Administered 2015-11-19: 69.8 mL/h via INTRAVENOUS
  Filled 2015-11-18: qty 40

## 2015-11-18 MED ORDER — SODIUM CHLORIDE 0.9 % IV SOLN
1500.0000 mg | INTRAVENOUS | Status: AC
  Administered 2015-11-19: 1500 mg via INTRAVENOUS
  Filled 2015-11-18: qty 1500

## 2015-11-18 MED ORDER — VANCOMYCIN HCL 10 G IV SOLR: 1250.0000 mg | INTRAVENOUS | Status: DC

## 2015-11-18 MED ORDER — SODIUM CHLORIDE 0.9 % IV SOLN
INTRAVENOUS | Status: AC
  Administered 2015-11-19: .7 [IU]/h via INTRAVENOUS
  Filled 2015-11-18: qty 2.5

## 2015-11-18 MED ORDER — DEXTROSE 5 % IV SOLN
750.0000 mg | INTRAVENOUS | Status: DC
  Filled 2015-11-18: qty 750

## 2015-11-18 MED ORDER — TEMAZEPAM 15 MG PO CAPS: 15.0000 mg | ORAL_CAPSULE | Freq: Once | ORAL | Status: DC | PRN

## 2015-11-18 MED ORDER — PLASMA-LYTE 148 IV SOLN
INTRAVENOUS | Status: AC
  Administered 2015-11-19: 500 mL
  Filled 2015-11-18: qty 2.5

## 2015-11-18 MED ORDER — DEXMEDETOMIDINE HCL IN NACL 400 MCG/100ML IV SOLN
0.1000 ug/kg/h | INTRAVENOUS | Status: AC
  Administered 2015-11-19: .3 ug/kg/h via INTRAVENOUS
  Filled 2015-11-18: qty 100

## 2015-11-18 MED ORDER — NITROGLYCERIN IN D5W 200-5 MCG/ML-% IV SOLN
2.0000 ug/min | INTRAVENOUS | Status: DC
  Filled 2015-11-18: qty 250

## 2015-11-18 MED ORDER — SODIUM CHLORIDE 0.9 % IV SOLN
INTRAVENOUS | Status: DC
  Filled 2015-11-18: qty 30

## 2015-11-18 MED ORDER — CHLORHEXIDINE GLUCONATE 4 % EX LIQD
60.0000 mL | Freq: Once | CUTANEOUS | Status: AC
  Administered 2015-11-19: 4 via TOPICAL
  Filled 2015-11-18: qty 15

## 2015-11-18 MED ORDER — CHLORHEXIDINE GLUCONATE 0.12 % MT SOLN
15.0000 mL | Freq: Once | OROMUCOSAL | Status: AC
  Administered 2015-11-19: 15 mL via OROMUCOSAL
  Filled 2015-11-18: qty 15

## 2015-11-18 MED ORDER — CHLORHEXIDINE GLUCONATE 4 % EX LIQD
60.0000 mL | Freq: Once | CUTANEOUS | Status: AC
  Administered 2015-11-18: 4 via TOPICAL
  Filled 2015-11-18: qty 60

## 2015-11-18 MED ORDER — DEXTROSE 5 % IV SOLN
1.5000 g | INTRAVENOUS | Status: AC
  Administered 2015-11-19: .75 g via INTRAVENOUS
  Administered 2015-11-19: 1.5 g via INTRAVENOUS
  Filled 2015-11-18: qty 1.5

## 2015-11-18 MED ORDER — EPINEPHRINE HCL 1 MG/ML IJ SOLN
0.0000 ug/min | INTRAMUSCULAR | Status: DC
  Filled 2015-11-18: qty 4

## 2015-11-18 MED ORDER — POTASSIUM CHLORIDE 2 MEQ/ML IV SOLN
80.0000 meq | INTRAVENOUS | Status: DC
  Filled 2015-11-18: qty 40

## 2015-11-18 MED ORDER — SODIUM CHLORIDE 0.9 % IV SOLN
INTRAVENOUS | Status: AC
  Administered 2015-11-19: 1000 mL
  Filled 2015-11-18: qty 1000

## 2015-11-18 MED ORDER — PHENYLEPHRINE HCL 10 MG/ML IJ SOLN
30.0000 ug/min | INTRAVENOUS | Status: AC
  Administered 2015-11-19: 20 ug/min via INTRAVENOUS
  Filled 2015-11-18: qty 2

## 2015-11-18 MED ORDER — DOPAMINE-DEXTROSE 3.2-5 MG/ML-% IV SOLN
0.0000 ug/kg/min | INTRAVENOUS | Status: DC
  Filled 2015-11-18: qty 250

## 2015-11-18 MED ORDER — MAGNESIUM SULFATE 50 % IJ SOLN
40.0000 meq | INTRAMUSCULAR | Status: DC
  Filled 2015-11-18: qty 10

## 2015-11-18 MED ORDER — METOPROLOL TARTRATE 12.5 MG HALF TABLET
12.5000 mg | ORAL_TABLET | Freq: Once | ORAL | Status: AC
  Administered 2015-11-19: 12.5 mg via ORAL
  Filled 2015-11-18: qty 1

## 2015-11-18 NOTE — Progress Notes (Signed)
ANTICOAGULATION CONSULT NOTE   Pharmacy Consult for Heparin Indication: chest pain/ACS  No Known Allergies  Patient Measurements: Height: 5\' 10"  (177.8 cm) Weight: 210 lb 1.6 oz (95.3 kg) IBW/kg (Calculated) : 73 Heparin Dosing Weight: 89.8  Vital Signs: Temp: 98.5 F (36.9 C) (08/22 1941) Temp Source: Oral (08/22 1941) BP: 140/97 (08/23 0218)  Labs:  Recent Labs  11/15/15 1412 11/15/15 1753  11/15/15 2348 11/16/15 0605 11/17/15 0525 11/18/15 0245  HGB 15.4  --   --   --  14.2 14.6 14.1  HCT 44.0  --   --   --  40.7 42.2 41.3  PLT 154  --   --   --  139* 139* 158  APTT 31  --   --   --   --   --  68*  LABPROT 13.9  --   --   --   --   --  14.5  INR 1.07  --   --   --   --   --  1.13  HEPARINUNFRC  --   --   < > 0.46 0.36 0.54 0.29*  CREATININE 0.96  --   --   --  0.88  --  0.88  TROPONINI 0.58* 0.47*  --  0.48* 0.33*  --   --   < > = values in this interval not displayed.  Estimated Creatinine Clearance: 102.1 mL/min (by C-G formula based on SCr of 0.88 mg/dL).   Medical History: Past Medical History:  Diagnosis Date  . CAD (coronary artery disease) 11/17/2015  . Essential hypertension 11/15/2015  . Glaucoma   . NSTEMI (non-ST elevated myocardial infarction) ([place]) 11/16/2015    Medications:  Scheduled:  . aspirin EC  81 mg Oral Daily  . atorvastatin  40 mg Oral q1800  . metoprolol tartrate  25 mg Oral BID  . multivitamin with minerals  1 tablet Oral Daily  . nitroGLYCERIN  1 inch Topical Q6H  . sodium chloride flush  3 mL Intravenous Q12H   Infusions:  . heparin 1,150 Units/hr (11/18/15 0200)    Assessment: 60yo male transfers over from [facility] on heparin for consideration of CABG. Heparin level down to slightly subtherapeutic on 1150 units/hr. No issues with line or bleeding reported per RN.  Noted tentative plan for CABG 8/24  Goal of Therapy:  Heparin level 0.3-0.7 units/ml Monitor platelets by anticoagulation protocol: Yes   Plan:   Increase heparin to 1250 units/hr F/u 6 hr heparin level  Sherlon Handing, PharmD, BCPS Clinical pharmacist, pager [phone] 11/18/2015 4:16 AM

## 2015-11-18 NOTE — Progress Notes (Signed)
ANTICOAGULATION CONSULT NOTE   Pharmacy Consult for Heparin Indication: chest pain/ACS  No Known Allergies  Patient Measurements: Height: 5\' 10"  (177.8 cm) Weight: 206 lb 8 oz (93.7 kg) IBW/kg (Calculated) : 73 Heparin Dosing Weight: 89.8  Vital Signs: Temp: 98.6 F (37 C) (08/23 0749) Temp Source: Oral (08/23 0749) BP: 130/98 (08/23 0946) Pulse Rate: 102 (08/23 0946)  Labs:  Recent Labs  11/15/15 1412 11/15/15 1753  11/15/15 2348 11/16/15 0605 11/17/15 0525 11/18/15 0245 11/18/15 1052  HGB 15.4  --   --   --  14.2 14.6 14.1  --   HCT 44.0  --   --   --  40.7 42.2 41.3  --   PLT 154  --   --   --  139* 139* 158  --   APTT 31  --   --   --   --   --  68*  --   LABPROT 13.9  --   --   --   --   --  14.5  --   INR 1.07  --   --   --   --   --  1.13  --   HEPARINUNFRC  --   --   < > 0.46 0.36 0.54 0.29* 0.36  CREATININE 0.96  --   --   --  0.88  --  0.88  --   TROPONINI 0.58* 0.47*  --  0.48* 0.33*  --   --   --   < > = values in this interval not displayed.  Estimated Creatinine Clearance: 101.4 mL/min (by C-G formula based on SCr of 0.88 mg/dL).   Assessment: 60yo male transfers over from [facility] on heparin for consideration of CABG.   Heparin level down now therapeutic on 1250 units/hr. No bleeding noted. Plans for CABG tomorrow with Dr. Roxy Manns.  Goal of Therapy:  Heparin level 0.3-0.7 units/ml Monitor platelets by anticoagulation protocol: Yes   Plan:  Continue heparin at 1250 units/hr Daily HL and CBC Heparin to be turned off at 0800 on 8/24 for CABG  Lillah Standre D. Joud Pettinato, PharmD, BCPS Clinical Pharmacist Pager: [phone] 11/18/2015 11:25 AM

## 2015-11-18 NOTE — Progress Notes (Signed)
Dr Roxy Manns at bedside rounding on patient, states "its ok for patient to remove telemetry for shower this am and also for Hibiclens showers."

## 2015-11-18 NOTE — Progress Notes (Signed)
CARDIAC REHAB PHASE I   Pt not to ambulate in hallway at this time per MD. Cardiac surgery pre-op education completed with pt and wife at bedside. Reviewed cardiac surgery booklet and cardiac surgery guidelines, IS, activity progression and sternal precautions. Pt and wife verbalized understanding, asked appropriate questions. Pt has instructions to view cardiac surgery videos, states he has seen two of the three already. Pt in bed, call bell within reach. Will follow post-op.    [place], RN, BSN 11/18/2015 2:47 PM

## 2015-11-18 NOTE — Discharge Summary (Signed)
[place] [facility] [place] NAME: Clinton Lyons    MR#:  [id number]  DATE OF BIRTH:  Aug 12, 1955  DATE OF ADMISSION:  11/15/2015 ADMITTING PHYSICIAN: Idelle Crouch, MD  DATE OF DISCHARGE: 11/17/2015 11:06 AM  PRIMARY CARE PHYSICIAN: [facility]   ADMISSION DIAGNOSIS:  NSTEMI (non-ST elevated myocardial infarction) ([place]) [I21.4]  DISCHARGE DIAGNOSIS:  Active Problems:   Unstable angina (HCC)   Elevated troponin   Essential hypertension   Glaucoma   NSTEMI (non-ST elevated myocardial infarction) ([place])   SECONDARY DIAGNOSIS:   Past Medical History:  Diagnosis Date  . CAD (coronary artery disease) 11/17/2015  . Essential hypertension 11/15/2015  . Glaucoma   . NSTEMI (non-ST elevated myocardial infarction) ([place]) 11/16/2015     ADMITTING HISTORY  Chief Complaint: chest pain  HPI: Clinton Lyons is a 60 y.o. male has a past medical history significant for glaucoma who presents to ER with recurrent episodes of left chest pain described as tightness radiating to left arm. Had an episode last night at rest lasting over 30 minutes. Denies sweats, SOB or N/V. In ER, ekg non-acute but troponin mildly elevated. He is now admitted. No personal cardiac hx but does have FH of CAD.  Review of Systems: The patient denies anorexia, fever, weight loss,, vision loss, decreased hearing, hoarseness,  syncope, dyspnea on exertion, peripheral edema, balance deficits, hemoptysis, abdominal pain, melena, hematochezia, severe indigestion/heartburn, hematuria, incontinence, genital sores, muscle weakness, suspicious skin lesions, transient blindness, difficulty walking, depression, unusual weight change, abnormal bleeding, enlarged lymph nodes, angioedema, and breast masses.    HOSPITAL COURSE:   * NSTEMI  Patient was admitted for chest pain and elevated troponin. Started on heparin drip. Aspirin, statin and beta blocker. He did not  have any further chest pain in the hospital. Patient had catheterization and was found to have two-vessel disease and CABG was advised by cardiology. Patient is continued on heparin drip in medications. Transferred to [facility] for CABG.  Discussed with patient and family at bedside prior to transfer.  CONSULTS OBTAINED:  Treatment Team:  Isaias Cowman, MD Corey Skains, MD Rexene Alberts, MD  DRUG ALLERGIES:  No Known Allergies  DISCHARGE MEDICATIONS:   Discharge Medication List as of 11/17/2015 11:06 AM    CONTINUE these medications which have NOT CHANGED   Details  aspirin 81 MG chewable tablet Chew 81 mg by mouth every morning., Historical Med    Cholecalciferol (VITAMIN D3) 2000 units capsule Take 2,000 Units by mouth every morning., Historical Med    glucosamine-chondroitin 500-400 MG tablet Take 1 tablet by mouth every morning., Historical Med    latanoprost (XALATAN) 0.005 % ophthalmic solution Place 1 drop into the right eye at bedtime., Historical Med    Multiple Vitamins-Minerals (PX ADVANCED FORMULA MULTIVITS) TABS Take 1 tablet by mouth every morning., Historical Med        Today   VITAL SIGNS:  Blood pressure 129/88, pulse (!) 107, temperature 97.4 F (36.3 C), resp. rate 18, height 5\' 9"  (1.753 m), weight 95.3 kg (210 lb 3.2 oz), SpO2 98 %.  I/O:  No intake or output data in the 24 hours ending 11/18/15 1432  PHYSICAL EXAMINATION:  Physical Exam  GENERAL:  60 y.o.-year-old patient lying in the bed with no acute distress.  LUNGS: Normal breath sounds bilaterally, no wheezing, rales,rhonchi or crepitation. No use of accessory muscles of respiration.  CARDIOVASCULAR: S1, S2 normal. No murmurs, rubs, or  gallops.  ABDOMEN: Soft, non-tender, non-distended. Bowel sounds present. No organomegaly or mass.  NEUROLOGIC: Moves all 4 extremities. PSYCHIATRIC: The patient is alert and oriented x 3.  SKIN: No obvious rash, lesion, or ulcer.    DATA REVIEW:   CBC  Recent Labs Lab 11/18/15 0245  WBC 7.7  HGB 14.1  HCT 41.3  PLT 158    Chemistries   Recent Labs Lab 11/18/15 0245  NA 138  K 3.6  CL 107  CO2 26  GLUCOSE 110*  BUN 8  CREATININE 0.88  CALCIUM 8.6*  AST 26  ALT 23  ALKPHOS 97  BILITOT 1.0    Cardiac Enzymes  Recent Labs Lab 11/16/15 0605  TROPONINI 0.33*    Microbiology Results  No results found for this or any previous visit.  RADIOLOGY:  No results found.  Follow up with PCP in 1 week.  Management plans discussed with the patient, family and they are in agreement.  CODE STATUS:  Code Status History    Date Active Date Inactive Code Status Order ID Comments User Context   11/17/2015  1:47 PM 11/17/2015  5:27 PM Full Code [id number]  Nani Skillern, PA-C Inpatient   11/16/2015  4:11 PM 11/17/2015 11:38 AM Full Code [id number]  Isaias Cowman, MD Inpatient   11/15/2015  5:44 PM 11/16/2015  8:58 AM Full Code [id number]  Idelle Crouch, MD Inpatient    Advance Directive Documentation   Flowsheet Row Most Recent Value  Type of Advance Directive  Healthcare Power of Attorney  Pre-existing out of facility DNR order (yellow form or pink MOST form)  No data  "MOST" Form in Place?  No data      TOTAL TIME TAKING CARE OF THIS PATIENT ON DAY OF DISCHARGE: more than 30 minutes.   Hillary Bow R M.D on 11/18/2015 at 2:32 PM  Between 7am to 6pm - Pager - [phone]  After 6pm go to [email or web address] - password [email or web address] [facility]  Office  [phone]  CC: Primary care physician; [facility]  Note: This dictation was prepared with Dragon dictation along with smaller phrase technology. Any transcriptional errors that result from this process are unintentional.

## 2015-11-18 NOTE — Progress Notes (Addendum)
Assessment/Plan: Acute Coronary Syndrome: unchanged.  Principal Problem:   NSTEMI (non-ST elevated myocardial infarction) ([name]) Active Problems:   Unstable angina (HCC)   Elevated troponin   Essential hypertension   CAD (coronary artery disease)   1 very stable on current rx- cont same with plans for OR tomorrow     LOS: 1 day   Subjective: Interval History: none.  Scheduled Meds: . aspirin EC  81 mg Oral Daily  . atorvastatin  40 mg Oral q1800  . metoprolol tartrate  25 mg Oral BID  . multivitamin with minerals  1 tablet Oral Daily  . nitroGLYCERIN  1 inch Topical Q6H  . sodium chloride flush  3 mL Intravenous Q12H   Continuous Infusions: . heparin 1,250 Units/hr (11/18/15 0700)   PRN Meds:sodium chloride, acetaminophen **OR** acetaminophen, nitroGLYCERIN, ondansetron **OR** ondansetron (ZOFRAN) IV, senna-docusate, sodium chloride flush, traMADol  Objective:  Vital signs in last 24 hours: Temp:  [97.4 F (36.3 C)-98.7 F (37.1 C)] 98.6 F (37 C) (08/23 0749) Pulse Rate:  [98-108] 98 (08/23 0749) Resp:  [16-19] 16 (08/23 0749) BP: (128-143)/(88-107) 130/93 (08/23 0749) SpO2:  [96 %-100 %] 100 % (08/23 0749) Weight:  [206 lb 8 oz (93.7 kg)-210 lb 1.6 oz (95.3 kg)] 206 lb 8 oz (93.7 kg) (08/23 0500)  Intake/Output last 3 shifts: I/O last 3 completed shifts: In: 398.2 [P.O.:200; I.V.:198.2] Out: 2200 [Urine:2200] Intake/Output this shift: No intake/output data recorded.  BP (!) 130/93   Pulse 98   Temp 98.6 F (37 C) (Oral)   Resp 16   Ht 5\' 10"  (1.778 m)   Wt 206 lb 8 oz (93.7 kg)   SpO2 100%   BMI 29.63 kg/m  General appearance: alert, cooperative and no distress Neck: no JVD Lungs: clear to auscultation bilaterally Heart: regular rate and rhythm, S1, S2 normal, no murmur, click, rub or gallop Abdomen: soft, non-tender; bowel sounds normal; no masses,  no organomegaly Extremities: no edema  Cardiographics ECG: anterior t wave changes improved  . Echocardiogram: see results 8/21  Imaging Chest X-Ray: no new      I have seen and examined the patient and agree with the assessment and plan as outlined.  For OR tomorrow.  All questions answered.  Rexene Alberts, MD 11/18/2015 9:46 AM

## 2015-11-19 ENCOUNTER — Inpatient Hospital Stay (HOSPITAL_COMMUNITY): Payer: BLUE CROSS/BLUE SHIELD

## 2015-11-19 ENCOUNTER — Encounter (HOSPITAL_COMMUNITY)
Admission: AD | Disposition: A | Discharge status: Home / Self Care | Payer: Self-pay | Source: Other Acute Inpatient Hospital | Attending: Thoracic Surgery (Cardiothoracic Vascular Surgery)

## 2015-11-19 ENCOUNTER — Encounter (HOSPITAL_COMMUNITY): Payer: Self-pay | Admitting: Certified Registered"

## 2015-11-19 ENCOUNTER — Inpatient Hospital Stay (HOSPITAL_COMMUNITY): Payer: BLUE CROSS/BLUE SHIELD | Admitting: Certified Registered"

## 2015-11-19 DIAGNOSIS — Z951 Presence of aortocoronary bypass graft: Secondary | ICD-10-CM

## 2015-11-19 HISTORY — DX: Presence of aortocoronary bypass graft: Z95.1

## 2015-11-19 HISTORY — PX: TEE WITHOUT CARDIOVERSION: SHX5443

## 2015-11-19 HISTORY — PX: CORONARY ARTERY BYPASS GRAFT: SHX141

## 2015-11-19 LAB — CBC
HCT: 42.5 % (ref 39.0–52.0)
Hemoglobin: 14.1 g/dL (ref 13.0–17.0)
MCH: 30.9 pg (ref 26.0–34.0)
MCHC: 33.2 g/dL (ref 30.0–36.0)
MCV: 93.2 fL (ref 78.0–100.0)
PLATELETS: 140 10*3/uL — AB (ref 150–400)
RBC: 4.56 MIL/uL (ref 4.22–5.81)
RDW: 12.7 % (ref 11.5–15.5)
WBC: 7.5 10*3/uL (ref 4.0–10.5)

## 2015-11-19 LAB — POCT I-STAT, CHEM 8
BUN: 9 mg/dL (ref 6–20)
BUN: 8 mg/dL (ref 6–20)
BUN: 9 mg/dL (ref 6–20)
BUN: 9 mg/dL (ref 6–20)
BUN: 9 mg/dL (ref 6–20)
CALCIUM ION: 1.25 mmol/L — AB (ref 1.12–1.23)
CALCIUM ION: 0.98 mmol/L — AB (ref 1.12–1.23)
CALCIUM ION: 1 mmol/L — AB (ref 1.12–1.23)
CALCIUM ION: 1.06 mmol/L — AB (ref 1.12–1.23)
CHLORIDE: 102 mmol/L (ref 101–111)
CHLORIDE: 102 mmol/L (ref 101–111)
CHLORIDE: 99 mmol/L — AB (ref 101–111)
CHLORIDE: 97 mmol/L — AB (ref 101–111)
CREATININE: 0.5 mg/dL — AB (ref 0.61–1.24)
CREATININE: 0.8 mg/dL (ref 0.61–1.24)
Calcium, Ion: 1.18 mmol/L (ref 1.12–1.23)
Chloride: 97 mmol/L — ABNORMAL LOW (ref 101–111)
Creatinine, Ser: 0.7 mg/dL (ref 0.61–1.24)
Creatinine, Ser: 0.6 mg/dL — ABNORMAL LOW (ref 0.61–1.24)
Creatinine, Ser: 0.5 mg/dL — ABNORMAL LOW (ref 0.61–1.24)
GLUCOSE: 108 mg/dL — AB (ref 65–99)
GLUCOSE: 128 mg/dL — AB (ref 65–99)
GLUCOSE: 127 mg/dL — AB (ref 65–99)
Glucose, Bld: 96 mg/dL (ref 65–99)
Glucose, Bld: 100 mg/dL — ABNORMAL HIGH (ref 65–99)
HCT: 34 % — ABNORMAL LOW (ref 39.0–52.0)
HCT: 24 % — ABNORMAL LOW (ref 39.0–52.0)
HCT: 24 % — ABNORMAL LOW (ref 39.0–52.0)
HEMATOCRIT: 39 % (ref 39.0–52.0)
HEMATOCRIT: 27 % — AB (ref 39.0–52.0)
Hemoglobin: 13.3 g/dL (ref 13.0–17.0)
Hemoglobin: 11.6 g/dL — ABNORMAL LOW (ref 13.0–17.0)
Hemoglobin: 9.2 g/dL — ABNORMAL LOW (ref 13.0–17.0)
Hemoglobin: 8.2 g/dL — ABNORMAL LOW (ref 13.0–17.0)
Hemoglobin: 8.2 g/dL — ABNORMAL LOW (ref 13.0–17.0)
POTASSIUM: 3.9 mmol/L (ref 3.5–5.1)
POTASSIUM: 4.1 mmol/L (ref 3.5–5.1)
Potassium: 4.3 mmol/L (ref 3.5–5.1)
Potassium: 4.3 mmol/L (ref 3.5–5.1)
Potassium: 4.3 mmol/L (ref 3.5–5.1)
SODIUM: 141 mmol/L (ref 135–145)
SODIUM: 140 mmol/L (ref 135–145)
Sodium: 139 mmol/L (ref 135–145)
Sodium: 130 mmol/L — ABNORMAL LOW (ref 135–145)
Sodium: 135 mmol/L (ref 135–145)
TCO2: 27 mmol/L (ref 0–100)
TCO2: 26 mmol/L (ref 0–100)
TCO2: 27 mmol/L (ref 0–100)
TCO2: 27 mmol/L (ref 0–100)
TCO2: 23 mmol/L (ref 0–100)

## 2015-11-19 LAB — POCT I-STAT 3, ART BLOOD GAS (G3+)
ACID-BASE DEFICIT: 1 mmol/L (ref 0.0–2.0)
ACID-BASE DEFICIT: 2 mmol/L (ref 0.0–2.0)
Acid-Base Excess: 1 mmol/L (ref 0.0–2.0)
Acid-Base Excess: 1 mmol/L (ref 0.0–2.0)
BICARBONATE: 26.4 meq/L — AB (ref 20.0–24.0)
Bicarbonate: 24.9 mEq/L — ABNORMAL HIGH (ref 20.0–24.0)
Bicarbonate: 22.7 mEq/L (ref 20.0–24.0)
Bicarbonate: 24.2 mEq/L — ABNORMAL HIGH (ref 20.0–24.0)
O2 SAT: 100 %
O2 SAT: 98 %
O2 SAT: 97 %
O2 Saturation: 100 %
PCO2 ART: 45.6 mmHg — AB (ref 35.0–45.0)
PCO2 ART: 36.7 mmHg (ref 35.0–45.0)
PH ART: 7.371 (ref 7.350–7.450)
PH ART: 7.317 — AB (ref 7.350–7.450)
PO2 ART: 379 mmHg — AB (ref 80.0–100.0)
PO2 ART: 491 mmHg — AB (ref 80.0–100.0)
TCO2: 28 mmol/L (ref 0–100)
TCO2: 26 mmol/L (ref 0–100)
TCO2: 24 mmol/L (ref 0–100)
TCO2: 26 mmol/L (ref 0–100)
pCO2 arterial: 34.7 mmHg — ABNORMAL LOW (ref 35.0–45.0)
pCO2 arterial: 47 mmHg — ABNORMAL HIGH (ref 35.0–45.0)
pH, Arterial: 7.44 (ref 7.350–7.450)
pH, Arterial: 7.424 (ref 7.350–7.450)
pO2, Arterial: 98 mmHg (ref 80.0–100.0)
pO2, Arterial: 95 mmHg (ref 80.0–100.0)

## 2015-11-19 LAB — PLATELET COUNT: PLATELETS: 94 10*3/uL — AB (ref 150–400)

## 2015-11-19 LAB — PROTIME-INR
INR: 1.47
Prothrombin Time: 17.9 seconds — ABNORMAL HIGH (ref 11.4–15.2)

## 2015-11-19 LAB — BASIC METABOLIC PANEL
Anion gap: 6 (ref 5–15)
BUN: 8 mg/dL (ref 6–20)
CALCIUM: 9 mg/dL (ref 8.9–10.3)
CO2: 27 mmol/L (ref 22–32)
CREATININE: 0.97 mg/dL (ref 0.61–1.24)
Chloride: 104 mmol/L (ref 101–111)
GFR calc Af Amer: >60 mL/min (ref >60)
GLUCOSE: 108 mg/dL — AB (ref 65–99)
Potassium: 4.1 mmol/L (ref 3.5–5.1)
Sodium: 137 mmol/L (ref 135–145)

## 2015-11-19 LAB — HEMOGLOBIN AND HEMATOCRIT, BLOOD
HEMATOCRIT: 25.6 % — AB (ref 39.0–52.0)
Hemoglobin: 8.9 g/dL — ABNORMAL LOW (ref 13.0–17.0)

## 2015-11-19 LAB — HEPARIN LEVEL (UNFRACTIONATED): Heparin Unfractionated: 0.37 IU/mL (ref 0.30–0.70)

## 2015-11-19 LAB — POCT I-STAT 4, (NA,K, GLUC, HGB,HCT)
GLUCOSE: 114 mg/dL — AB (ref 65–99)
HEMATOCRIT: 31 % — AB (ref 39.0–52.0)
HEMOGLOBIN: 10.5 g/dL — AB (ref 13.0–17.0)
POTASSIUM: 3.8 mmol/L (ref 3.5–5.1)
Sodium: 140 mmol/L (ref 135–145)

## 2015-11-19 LAB — GLUCOSE, CAPILLARY
GLUCOSE-CAPILLARY: 117 mg/dL — AB (ref 65–99)
GLUCOSE-CAPILLARY: 117 mg/dL — AB (ref 65–99)
Glucose-Capillary: 123 mg/dL — ABNORMAL HIGH (ref 65–99)
Glucose-Capillary: 120 mg/dL — ABNORMAL HIGH (ref 65–99)

## 2015-11-19 LAB — APTT: APTT: 34 s (ref 24–36)

## 2015-11-19 LAB — HEMOGLOBIN A1C
HEMOGLOBIN A1C: 5.4 % (ref 4.8–5.6)
Mean Plasma Glucose: 108 mg/dL

## 2015-11-19 SURGERY — CORONARY ARTERY BYPASS GRAFTING (CABG)
Anesthesia: General | Site: Chest

## 2015-11-19 MED ORDER — PROPOFOL 10 MG/ML IV BOLUS
INTRAVENOUS | Status: AC
Start: 2015-11-19 — End: 2015-11-19
  Filled 2015-11-19: qty 20

## 2015-11-19 MED ORDER — BISACODYL 10 MG RE SUPP: 10.0000 mg | Freq: Every day | RECTAL | Status: DC

## 2015-11-19 MED ORDER — METOPROLOL TARTRATE 25 MG/10 ML ORAL SUSPENSION: 12.5000 mg | Freq: Two times a day (BID) | ORAL | Status: DC

## 2015-11-19 MED ORDER — ROCURONIUM BROMIDE 10 MG/ML (PF) SYRINGE
PREFILLED_SYRINGE | INTRAVENOUS | Status: AC
Start: 2015-11-19 — End: 2015-11-19
  Filled 2015-11-19: qty 20

## 2015-11-19 MED ORDER — MAGNESIUM SULFATE 4 GM/100ML IV SOLN
4.0000 g | Freq: Once | INTRAVENOUS | Status: AC
  Administered 2015-11-19: 4 g via INTRAVENOUS
  Filled 2015-11-19: qty 100

## 2015-11-19 MED ORDER — SODIUM CHLORIDE 0.9 % IV SOLN: INTRAVENOUS | Status: DC

## 2015-11-19 MED ORDER — NITROGLYCERIN IN D5W 200-5 MCG/ML-% IV SOLN: 0.0000 ug/min | INTRAVENOUS | Status: DC

## 2015-11-19 MED ORDER — SODIUM CHLORIDE 0.9 % IJ SOLN
OROMUCOSAL | Status: DC | PRN
  Administered 2015-11-19: 15:00:00 via TOPICAL
  Administered 2015-11-19: 4 mL via TOPICAL

## 2015-11-19 MED ORDER — LACTATED RINGERS IV SOLN: INTRAVENOUS | Status: DC

## 2015-11-19 MED ORDER — LACTATED RINGERS IV SOLN
INTRAVENOUS | Status: DC | PRN
Start: 2015-11-19 — End: 2015-11-19
  Administered 2015-11-19 (×2): via INTRAVENOUS

## 2015-11-19 MED ORDER — PHENYLEPHRINE HCL 10 MG/ML IJ SOLN
0.0000 ug/min | INTRAVENOUS | Status: DC
  Administered 2015-11-20: 35 ug/min via INTRAVENOUS
  Filled 2015-11-19: qty 2

## 2015-11-19 MED ORDER — CHLORHEXIDINE GLUCONATE 4 % EX LIQD
CUTANEOUS | Status: AC
  Filled 2015-11-19: qty 60

## 2015-11-19 MED ORDER — MIDAZOLAM HCL 2 MG/2ML IJ SOLN
2.0000 mg | INTRAMUSCULAR | Status: DC | PRN
  Administered 2015-11-19 – 2015-11-20 (×4): 2 mg via INTRAVENOUS
  Filled 2015-11-19 (×4): qty 2

## 2015-11-19 MED ORDER — ASPIRIN 81 MG PO CHEW: 324.0000 mg | CHEWABLE_TABLET | Freq: Every day | ORAL | Status: DC

## 2015-11-19 MED ORDER — ASPIRIN EC 325 MG PO TBEC
325.0000 mg | DELAYED_RELEASE_TABLET | Freq: Every day | ORAL | Status: DC
  Administered 2015-11-20 – 2015-11-22 (×3): 325 mg via ORAL
  Filled 2015-11-19 (×3): qty 1

## 2015-11-19 MED ORDER — DEXMEDETOMIDINE HCL IN NACL 200 MCG/50ML IV SOLN
0.0000 ug/kg/h | INTRAVENOUS | Status: DC
  Administered 2015-11-19 – 2015-11-20 (×2): 0.6 ug/kg/h via INTRAVENOUS
  Filled 2015-11-19 (×4): qty 50

## 2015-11-19 MED ORDER — METOPROLOL TARTRATE 12.5 MG HALF TABLET
12.5000 mg | ORAL_TABLET | Freq: Two times a day (BID) | ORAL | Status: DC
  Administered 2015-11-20 – 2015-11-21 (×3): 12.5 mg via ORAL
  Filled 2015-11-19 (×3): qty 1

## 2015-11-19 MED ORDER — MORPHINE SULFATE (PF) 2 MG/ML IV SOLN
2.0000 mg | INTRAVENOUS | Status: DC | PRN
  Filled 2015-11-19: qty 2

## 2015-11-19 MED ORDER — MIDAZOLAM HCL 2 MG/2ML IJ SOLN
INTRAMUSCULAR | Status: AC
  Administered 2015-11-19: 2 mg
  Filled 2015-11-19: qty 2

## 2015-11-19 MED ORDER — OXYCODONE HCL 5 MG PO TABS
5.0000 mg | ORAL_TABLET | ORAL | Status: DC | PRN
  Administered 2015-11-20: 5 mg via ORAL
  Filled 2015-11-19: qty 2

## 2015-11-19 MED ORDER — ROCURONIUM BROMIDE 100 MG/10ML IV SOLN
INTRAVENOUS | Status: DC | PRN
  Administered 2015-11-19: 30 mg via INTRAVENOUS
  Administered 2015-11-19: 50 mg via INTRAVENOUS
  Administered 2015-11-19: 100 mg via INTRAVENOUS

## 2015-11-19 MED ORDER — ETOMIDATE 2 MG/ML IV SOLN
INTRAVENOUS | Status: DC | PRN
  Administered 2015-11-19: 16 mg via INTRAVENOUS

## 2015-11-19 MED ORDER — PROTAMINE SULFATE 10 MG/ML IV SOLN
INTRAVENOUS | Status: DC | PRN
  Administered 2015-11-19: 280 mg via INTRAVENOUS

## 2015-11-19 MED ORDER — SODIUM CHLORIDE 0.9 % IJ SOLN
OROMUCOSAL | Status: DC | PRN
  Administered 2015-11-19: 15:00:00 via TOPICAL

## 2015-11-19 MED ORDER — PHENYLEPHRINE HCL 10 MG/ML IJ SOLN
INTRAMUSCULAR | Status: DC | PRN
  Administered 2015-11-19 (×7): 40 ug via INTRAVENOUS
  Administered 2015-11-19: 80 ug via INTRAVENOUS
  Administered 2015-11-19 (×5): 40 ug via INTRAVENOUS

## 2015-11-19 MED ORDER — 0.9 % SODIUM CHLORIDE (POUR BTL) OPTIME
TOPICAL | Status: DC | PRN
  Administered 2015-11-19: 6000 mL

## 2015-11-19 MED ORDER — ACETAMINOPHEN 160 MG/5ML PO SOLN
1000.0000 mg | Freq: Four times a day (QID) | ORAL | Status: DC
  Administered 2015-11-20 (×2): 1000 mg
  Filled 2015-11-19 (×2): qty 40.6

## 2015-11-19 MED ORDER — ALBUMIN HUMAN 5 % IV SOLN
INTRAVENOUS | Status: DC | PRN
  Administered 2015-11-19: 18:00:00 via INTRAVENOUS

## 2015-11-19 MED ORDER — ARTIFICIAL TEARS OP OINT
TOPICAL_OINTMENT | OPHTHALMIC | Status: DC | PRN
  Administered 2015-11-19: 1 via OPHTHALMIC

## 2015-11-19 MED ORDER — MORPHINE SULFATE (PF) 2 MG/ML IV SOLN
1.0000 mg | INTRAVENOUS | Status: DC | PRN
  Administered 2015-11-19 – 2015-11-20 (×5): 2 mg via INTRAVENOUS
  Filled 2015-11-19 (×2): qty 2

## 2015-11-19 MED ORDER — SODIUM CHLORIDE 0.9 % IV SOLN
INTRAVENOUS | Status: DC
  Administered 2015-11-19: 19:00:00 via INTRAVENOUS

## 2015-11-19 MED ORDER — ALBUMIN HUMAN 5 % IV SOLN
250.0000 mL | INTRAVENOUS | Status: AC | PRN
  Administered 2015-11-19 (×4): 250 mL via INTRAVENOUS
  Filled 2015-11-19 (×2): qty 250

## 2015-11-19 MED ORDER — METOPROLOL TARTRATE 5 MG/5ML IV SOLN
2.5000 mg | INTRAVENOUS | Status: DC | PRN
  Administered 2015-11-21: 2.5 mg via INTRAVENOUS
  Filled 2015-11-19: qty 5

## 2015-11-19 MED ORDER — SODIUM CHLORIDE 0.9 % IV SOLN
INTRAVENOUS | Status: DC | PRN
  Administered 2015-11-19: 14:00:00 via INTRAVENOUS

## 2015-11-19 MED ORDER — LACTATED RINGERS IV SOLN
INTRAVENOUS | Status: DC
  Administered 2015-11-19: 11:00:00 via INTRAVENOUS

## 2015-11-19 MED ORDER — SODIUM CHLORIDE 0.45 % IV SOLN
INTRAVENOUS | Status: DC | PRN
  Administered 2015-11-19: 19:00:00 via INTRAVENOUS

## 2015-11-19 MED ORDER — LACTATED RINGERS IV SOLN: 500.0000 mL | Freq: Once | INTRAVENOUS | Status: DC | PRN

## 2015-11-19 MED ORDER — SODIUM CHLORIDE 0.9 % IJ SOLN
INTRAMUSCULAR | Status: AC
  Filled 2015-11-19: qty 10

## 2015-11-19 MED ORDER — ONDANSETRON HCL 4 MG/2ML IJ SOLN
4.0000 mg | Freq: Four times a day (QID) | INTRAMUSCULAR | Status: DC | PRN
  Filled 2015-11-19: qty 2

## 2015-11-19 MED ORDER — LACTATED RINGERS IV SOLN
INTRAVENOUS | Status: DC | PRN
  Administered 2015-11-19: 13:00:00 via INTRAVENOUS

## 2015-11-19 MED ORDER — FAMOTIDINE IN NACL 20-0.9 MG/50ML-% IV SOLN
20.0000 mg | Freq: Two times a day (BID) | INTRAVENOUS | Status: AC
  Administered 2015-11-19: 20 mg via INTRAVENOUS

## 2015-11-19 MED ORDER — CHLORHEXIDINE GLUCONATE 0.12 % MT SOLN
15.0000 mL | OROMUCOSAL | Status: AC
  Administered 2015-11-19: 15 mL via OROMUCOSAL

## 2015-11-19 MED ORDER — PHENYLEPHRINE 40 MCG/ML (10ML) SYRINGE FOR IV PUSH (FOR BLOOD PRESSURE SUPPORT)
PREFILLED_SYRINGE | INTRAVENOUS | Status: AC
  Filled 2015-11-19: qty 10

## 2015-11-19 MED ORDER — PANTOPRAZOLE SODIUM 40 MG PO TBEC
40.0000 mg | DELAYED_RELEASE_TABLET | Freq: Every day | ORAL | Status: DC
  Administered 2015-11-21 – 2015-11-22 (×2): 40 mg via ORAL
  Filled 2015-11-19 (×2): qty 1

## 2015-11-19 MED ORDER — ACETAMINOPHEN 160 MG/5ML PO SOLN: 650.0000 mg | Freq: Once | ORAL | Status: AC

## 2015-11-19 MED ORDER — FENTANYL CITRATE (PF) 100 MCG/2ML IJ SOLN
INTRAMUSCULAR | Status: DC | PRN
  Administered 2015-11-19 (×2): 150 ug via INTRAVENOUS
  Administered 2015-11-19: 50 ug via INTRAVENOUS
  Administered 2015-11-19 (×2): 100 ug via INTRAVENOUS
  Administered 2015-11-19: 250 ug via INTRAVENOUS
  Administered 2015-11-19: 100 ug via INTRAVENOUS
  Administered 2015-11-19: 150 ug via INTRAVENOUS

## 2015-11-19 MED ORDER — MIDAZOLAM HCL 5 MG/ML IJ SOLN
INTRAMUSCULAR | Status: DC | PRN
  Administered 2015-11-19: 2 mg via INTRAVENOUS
  Administered 2015-11-19: 1 mg via INTRAVENOUS
  Administered 2015-11-19 (×2): 2 mg via INTRAVENOUS
  Administered 2015-11-19 (×3): 1 mg via INTRAVENOUS

## 2015-11-19 MED ORDER — FENTANYL CITRATE (PF) 100 MCG/2ML IJ SOLN
INTRAMUSCULAR | Status: AC
  Administered 2015-11-19: 50 ug
  Filled 2015-11-19: qty 2

## 2015-11-19 MED ORDER — LIDOCAINE 2% (20 MG/ML) 5 ML SYRINGE
INTRAMUSCULAR | Status: AC
Start: 2015-11-19 — End: 2015-11-19
  Filled 2015-11-19: qty 5

## 2015-11-19 MED ORDER — ACETAMINOPHEN 500 MG PO TABS
1000.0000 mg | ORAL_TABLET | Freq: Four times a day (QID) | ORAL | Status: DC
  Administered 2015-11-20 – 2015-11-21 (×7): 1000 mg via ORAL
  Filled 2015-11-19 (×8): qty 2

## 2015-11-19 MED ORDER — VANCOMYCIN HCL IN DEXTROSE 1-5 GM/200ML-% IV SOLN
1000.0000 mg | Freq: Once | INTRAVENOUS | Status: AC
  Administered 2015-11-20: 1000 mg via INTRAVENOUS
  Filled 2015-11-19: qty 200

## 2015-11-19 MED ORDER — DEXTROSE 5 % IV SOLN
1.5000 g | Freq: Two times a day (BID) | INTRAVENOUS | Status: AC
  Administered 2015-11-20 – 2015-11-21 (×4): 1.5 g via INTRAVENOUS
  Filled 2015-11-19 (×4): qty 1.5

## 2015-11-19 MED ORDER — ALBUMIN HUMAN 5 % IV SOLN
INTRAVENOUS | Status: DC | PRN
  Administered 2015-11-19: 17:00:00 via INTRAVENOUS

## 2015-11-19 MED ORDER — MIDAZOLAM HCL 10 MG/2ML IJ SOLN
INTRAMUSCULAR | Status: AC
  Filled 2015-11-19: qty 2

## 2015-11-19 MED ORDER — SODIUM CHLORIDE 0.9 % IV SOLN
250.0000 mL | INTRAVENOUS | Status: DC
  Administered 2015-11-20: 250 mL via INTRAVENOUS

## 2015-11-19 MED ORDER — FENTANYL CITRATE (PF) 250 MCG/5ML IJ SOLN
INTRAMUSCULAR | Status: AC
  Filled 2015-11-19: qty 20

## 2015-11-19 MED ORDER — SODIUM CHLORIDE 0.9 % IV SOLN
INTRAVENOUS | Status: DC
  Administered 2015-11-19: 1.3 [IU]/h via INTRAVENOUS
  Filled 2015-11-19: qty 2.5

## 2015-11-19 MED ORDER — SODIUM CHLORIDE 0.9% FLUSH: 3.0000 mL | INTRAVENOUS | Status: DC | PRN

## 2015-11-19 MED ORDER — POTASSIUM CHLORIDE 10 MEQ/50ML IV SOLN
10.0000 meq | INTRAVENOUS | Status: AC
  Administered 2015-11-19 (×3): 10 meq via INTRAVENOUS

## 2015-11-19 MED ORDER — ANTISEPTIC ORAL RINSE SOLUTION (CORINZ)
7.0000 mL | OROMUCOSAL | Status: DC
  Administered 2015-11-19 – 2015-11-20 (×8): 7 mL via OROMUCOSAL

## 2015-11-19 MED ORDER — DOCUSATE SODIUM 100 MG PO CAPS
200.0000 mg | ORAL_CAPSULE | Freq: Every day | ORAL | Status: DC
  Administered 2015-11-20 – 2015-11-22 (×3): 200 mg via ORAL
  Filled 2015-11-19 (×4): qty 2

## 2015-11-19 MED ORDER — BISACODYL 5 MG PO TBEC
10.0000 mg | DELAYED_RELEASE_TABLET | Freq: Every day | ORAL | Status: DC
  Administered 2015-11-20 – 2015-11-21 (×2): 10 mg via ORAL
  Filled 2015-11-19 (×3): qty 2

## 2015-11-19 MED ORDER — PROPOFOL 10 MG/ML IV BOLUS
INTRAVENOUS | Status: DC | PRN
  Administered 2015-11-19: 20 mg via INTRAVENOUS

## 2015-11-19 MED ORDER — HEPARIN SODIUM (PORCINE) 1000 UNIT/ML IJ SOLN
INTRAMUSCULAR | Status: AC
  Filled 2015-11-19: qty 1

## 2015-11-19 MED ORDER — ACETAMINOPHEN 650 MG RE SUPP
650.0000 mg | Freq: Once | RECTAL | Status: AC
  Administered 2015-11-19: 650 mg via RECTAL

## 2015-11-19 MED ORDER — SODIUM CHLORIDE 0.9% FLUSH
3.0000 mL | Freq: Two times a day (BID) | INTRAVENOUS | Status: DC
  Administered 2015-11-20 – 2015-11-22 (×5): 3 mL via INTRAVENOUS

## 2015-11-19 MED ORDER — CHLORHEXIDINE GLUCONATE 0.12% ORAL RINSE (MEDLINE KIT)
15.0000 mL | Freq: Two times a day (BID) | OROMUCOSAL | Status: DC
  Administered 2015-11-20: 15 mL via OROMUCOSAL

## 2015-11-19 MED ORDER — INSULIN REGULAR BOLUS VIA INFUSION
0.0000 [IU] | Freq: Three times a day (TID) | INTRAVENOUS | Status: DC
  Filled 2015-11-19: qty 10

## 2015-11-19 MED ORDER — HEPARIN SODIUM (PORCINE) 1000 UNIT/ML IJ SOLN
INTRAMUSCULAR | Status: DC | PRN
  Administered 2015-11-19: 28000 [IU] via INTRAVENOUS

## 2015-11-19 MED ORDER — TRAMADOL HCL 50 MG PO TABS
50.0000 mg | ORAL_TABLET | ORAL | Status: DC | PRN
  Administered 2015-11-20 – 2015-11-21 (×2): 100 mg via ORAL
  Filled 2015-11-19 (×2): qty 2

## 2015-11-19 MED FILL — Magnesium Sulfate Inj 50%: INTRAMUSCULAR | Qty: 2 | Status: AC

## 2015-11-19 MED FILL — Heparin Sodium (Porcine) Inj 1000 Unit/ML: INTRAMUSCULAR | Qty: 30 | Status: AC

## 2015-11-19 MED FILL — Potassium Chloride Inj 2 mEq/ML: INTRAVENOUS | Qty: 40 | Status: AC

## 2015-11-19 SURGICAL SUPPLY — 105 items
BAG DECANTER FOR FLEXI CONT (MISCELLANEOUS) ×8 IMPLANT
BANDAGE ACE 4X5 VEL STRL LF (GAUZE/BANDAGES/DRESSINGS) ×4 IMPLANT
BANDAGE ACE 6X5 VEL STRL LF (GAUZE/BANDAGES/DRESSINGS) ×4 IMPLANT
BANDAGE ELASTIC 4 VELCRO ST LF (GAUZE/BANDAGES/DRESSINGS) ×4 IMPLANT
BANDAGE ELASTIC 6 VELCRO ST LF (GAUZE/BANDAGES/DRESSINGS) ×4 IMPLANT
BASKET HEART  (ORDER IN 25'S) (MISCELLANEOUS) ×1
BASKET HEART (ORDER IN 25'S) (MISCELLANEOUS) ×1
BASKET HEART (ORDER IN 25S) (MISCELLANEOUS) ×2 IMPLANT
BLADE STERNUM SYSTEM 6 (BLADE) ×4 IMPLANT
BLADE SURG 11 STRL SS (BLADE) ×4 IMPLANT
BLADE SURG ROTATE 9660 (MISCELLANEOUS) IMPLANT
BNDG GAUZE ELAST 4 BULKY (GAUZE/BANDAGES/DRESSINGS) ×4 IMPLANT
CANISTER SUCTION 2500CC (MISCELLANEOUS) ×4 IMPLANT
CANNULA EZ GLIDE AORTIC 21FR (CANNULA) ×8 IMPLANT
CATH CPB KIT OWEN (MISCELLANEOUS) ×4 IMPLANT
CATH THORACIC 36FR (CATHETERS) ×4 IMPLANT
CLIP TI MEDIUM 24 (CLIP) IMPLANT
CLIP TI WIDE RED SMALL 24 (CLIP) IMPLANT
CRADLE DONUT ADULT HEAD (MISCELLANEOUS) ×4 IMPLANT
DERMABOND ADVANCED (GAUZE/BANDAGES/DRESSINGS) ×2
DERMABOND ADVANCED .7 DNX12 (GAUZE/BANDAGES/DRESSINGS) ×2 IMPLANT
DRAIN CHANNEL 32F RND 10.7 FF (WOUND CARE) ×8 IMPLANT
DRAPE CARDIOVASCULAR INCISE (DRAPES) ×2
DRAPE INCISE IOBAN 66X45 STRL (DRAPES) ×4 IMPLANT
DRAPE SLUSH/WARMER DISC (DRAPES) ×4 IMPLANT
DRAPE SRG 135X102X78XABS (DRAPES) ×2 IMPLANT
DRSG AQUACEL AG ADV 3.5X14 (GAUZE/BANDAGES/DRESSINGS) ×4 IMPLANT
DRSG COVADERM 4X14 (GAUZE/BANDAGES/DRESSINGS) ×4 IMPLANT
ELECT BLADE 4.0 EZ CLEAN MEGAD (MISCELLANEOUS) ×4
ELECT REM PT RETURN 9FT ADLT (ELECTROSURGICAL) ×8
ELECTRODE BLDE 4.0 EZ CLN MEGD (MISCELLANEOUS) ×2 IMPLANT
ELECTRODE REM PT RTRN 9FT ADLT (ELECTROSURGICAL) ×4 IMPLANT
FELT TEFLON 1X6 (MISCELLANEOUS) ×8 IMPLANT
GAUZE SPONGE 4X4 12PLY STRL (GAUZE/BANDAGES/DRESSINGS) ×8 IMPLANT
GLOVE BIOGEL M 7.0 STRL (GLOVE) ×4 IMPLANT
GLOVE BIOGEL M STER SZ 6 (GLOVE) ×4 IMPLANT
GLOVE BIOGEL PI IND STRL 6 (GLOVE) ×6 IMPLANT
GLOVE BIOGEL PI IND STRL 7.0 (GLOVE) ×2 IMPLANT
GLOVE BIOGEL PI INDICATOR 6 (GLOVE) ×6
GLOVE BIOGEL PI INDICATOR 7.0 (GLOVE) ×2
GLOVE ORTHO TXT STRL SZ7.5 (GLOVE) ×8 IMPLANT
GOWN STRL REUS W/ TWL LRG LVL3 (GOWN DISPOSABLE) ×14 IMPLANT
GOWN STRL REUS W/TWL LRG LVL3 (GOWN DISPOSABLE) ×14
HEMOSTAT POWDER SURGIFOAM 1G (HEMOSTASIS) ×12 IMPLANT
INSERT FOGARTY XLG (MISCELLANEOUS) ×4 IMPLANT
KIT BASIN OR (CUSTOM PROCEDURE TRAY) ×4 IMPLANT
KIT ROOM TURNOVER OR (KITS) ×4 IMPLANT
KIT SUCTION CATH 14FR (SUCTIONS) ×12 IMPLANT
KIT VASOVIEW 6 PRO VH 2400 (KITS) ×4 IMPLANT
LEAD PACING MYOCARDI (MISCELLANEOUS) ×4 IMPLANT
MARKER GRAFT CORONARY BYPASS (MISCELLANEOUS) ×12 IMPLANT
NS IRRIG 1000ML POUR BTL (IV SOLUTION) ×24 IMPLANT
PACK OPEN HEART (CUSTOM PROCEDURE TRAY) ×4 IMPLANT
PAD ARMBOARD 7.5X6 YLW CONV (MISCELLANEOUS) ×8 IMPLANT
PAD ELECT DEFIB RADIOL ZOLL (MISCELLANEOUS) ×4 IMPLANT
PENCIL BUTTON HOLSTER BLD 10FT (ELECTRODE) ×4 IMPLANT
PUNCH AORTIC ROTATE  4.5MM 8IN (MISCELLANEOUS) ×4 IMPLANT
PUNCH AORTIC ROTATE 4.0MM (MISCELLANEOUS) IMPLANT
PUNCH AORTIC ROTATE 4.5MM 8IN (MISCELLANEOUS) IMPLANT
PUNCH AORTIC ROTATE 5MM 8IN (MISCELLANEOUS) IMPLANT
SET CARDIOPLEGIA MPS 5001102 (MISCELLANEOUS) ×4 IMPLANT
SOLUTION ANTI FOG 6CC (MISCELLANEOUS) IMPLANT
SPONGE LAP 18X18 X RAY DECT (DISPOSABLE) ×4 IMPLANT
SPONGE LAP 4X18 X RAY DECT (DISPOSABLE) IMPLANT
SUT BONE WAX W31G (SUTURE) ×4 IMPLANT
SUT ETHIBOND X763 2 0 SH 1 (SUTURE) ×8 IMPLANT
SUT MNCRL AB 3-0 PS2 18 (SUTURE) ×8 IMPLANT
SUT MNCRL AB 4-0 PS2 18 (SUTURE) ×4 IMPLANT
SUT PDS AB 1 CTX 36 (SUTURE) ×8 IMPLANT
SUT PROLENE 2 0 SH DA (SUTURE) IMPLANT
SUT PROLENE 3 0 SH DA (SUTURE) ×16 IMPLANT
SUT PROLENE 3 0 SH1 36 (SUTURE) IMPLANT
SUT PROLENE 4 0 RB 1 (SUTURE) ×4
SUT PROLENE 4 0 SH DA (SUTURE) IMPLANT
SUT PROLENE 4-0 RB1 .5 CRCL 36 (SUTURE) ×4 IMPLANT
SUT PROLENE 5 0 C 1 36 (SUTURE) IMPLANT
SUT PROLENE 6 0 C 1 30 (SUTURE) ×8 IMPLANT
SUT PROLENE 7.0 RB 3 (SUTURE) ×12 IMPLANT
SUT PROLENE 8 0 BV175 6 (SUTURE) ×20 IMPLANT
SUT PROLENE BLUE 7 0 (SUTURE) ×8 IMPLANT
SUT PROLENE POLY MONO (SUTURE) IMPLANT
SUT SILK  1 MH (SUTURE) ×12
SUT SILK 1 MH (SUTURE) ×12 IMPLANT
SUT STEEL 6MS V (SUTURE) IMPLANT
SUT STEEL STERNAL CCS#1 18IN (SUTURE) ×4 IMPLANT
SUT STEEL SZ 6 DBL 3X14 BALL (SUTURE) ×8 IMPLANT
SUT VIC AB 1 CTX 36 (SUTURE)
SUT VIC AB 1 CTX36XBRD ANBCTR (SUTURE) IMPLANT
SUT VIC AB 2-0 CT1 27 (SUTURE) ×2
SUT VIC AB 2-0 CT1 TAPERPNT 27 (SUTURE) ×2 IMPLANT
SUT VIC AB 2-0 CTX 27 (SUTURE) IMPLANT
SUT VIC AB 3-0 SH 27 (SUTURE)
SUT VIC AB 3-0 SH 27X BRD (SUTURE) IMPLANT
SUT VIC AB 3-0 X1 27 (SUTURE) IMPLANT
SUT VICRYL 4-0 PS2 18IN ABS (SUTURE) IMPLANT
SUTURE E-PAK OPEN HEART (SUTURE) ×4 IMPLANT
SYSTEM SAHARA CHEST DRAIN ATS (WOUND CARE) ×4 IMPLANT
TAPE CLOTH SURG 4X10 WHT LF (GAUZE/BANDAGES/DRESSINGS) ×4 IMPLANT
TAPE PAPER 2X10 WHT MICROPORE (GAUZE/BANDAGES/DRESSINGS) ×4 IMPLANT
TOWEL OR 17X24 6PK STRL BLUE (TOWEL DISPOSABLE) ×8 IMPLANT
TOWEL OR 17X26 10 PK STRL BLUE (TOWEL DISPOSABLE) ×8 IMPLANT
TRAY FOLEY IC TEMP SENS 16FR (CATHETERS) ×4 IMPLANT
TUBING INSUFFLATION (TUBING) ×4 IMPLANT
UNDERPAD 30X30 INCONTINENT (UNDERPADS AND DIAPERS) ×4 IMPLANT
WATER STERILE IRR 1000ML POUR (IV SOLUTION) ×8 IMPLANT

## 2015-11-19 NOTE — Progress Notes (Signed)
[place] for Heparin Indication: chest pain/ACS  Allergies  Allergen Reactions  . No Known Allergies     Patient Measurements: Height: 5\' 10"  (177.8 cm) Weight: 205 lb 0.4 oz (93 kg) IBW/kg (Calculated) : 73 Heparin Dosing Weight: 89.8  Vital Signs: Temp: 98 F (36.7 C) (08/24 0518) Temp Source: Oral (08/24 0518) BP: 128/93 (08/24 0518) Pulse Rate: 91 (08/24 0518)  Labs:  Recent Labs  11/17/15 0525 11/18/15 0245 11/18/15 1052 11/19/15 0445  HGB 14.6 14.1  --  14.1  HCT 42.2 41.3  --  42.5  PLT 139* 158  --  140*  APTT  --  68*  --   --   LABPROT  --  14.5  --   --   INR  --  1.13  --   --   HEPARINUNFRC 0.54 0.29* 0.36 0.37  CREATININE  --  0.88  --  0.97    Estimated Creatinine Clearance: 91.6 mL/min (by C-G formula based on SCr of 0.97 mg/dL).   Assessment: 60yo male transfers over from [facility] on heparin for consideration of CABG.   Heparin level 0.37  now therapeutic on 1250 units/hr. No bleeding noted. Plans for CABG today with Dr. Roxy Manns.  Goal of Therapy:  Heparin level 0.3-0.7 units/ml Monitor platelets by anticoagulation protocol: Yes   Plan:  Continue heparin at 1250 units/hr Daily HL and CBC Heparin to be turned off at 0800 on 8/24 for CABG Will follow up after [place].D. CPP, BCPS Clinical Pharmacist [phone] 11/19/2015 7:51 AM

## 2015-11-19 NOTE — Progress Notes (Signed)
Echocardiogram Echocardiogram Transesophageal has been performed.  Clinton Lyons 11/19/2015, 2:45 PM

## 2015-11-19 NOTE — Brief Op Note (Addendum)
11/17/2015 - 11/19/2015  5:15 PM  PATIENT:  Clinton Lyons  60 y.o. male  PRE-OPERATIVE DIAGNOSIS:  1. S/p NSTEMI 2.CAD  POST-OPERATIVE DIAGNOSIS:  1. S/p NSTEMI 2.CAD  PROCEDURE:  TRANSESOPHAGEAL ECHOCARDIOGRAM (TEE) MEDIAN STERNOTOMY for CORONARY ARTERY BYPASS GRAFTING (CABG), ON PUMPx 3 (LIMA to LAD, RIMA to DIAGONAL 2, and SVG to OM1)  USING BILATERAL INTERNAL MAMMARY ARTERIES and RIGHT GREATER SAPHENOUS THIGH VEIN HARVESTED ENDOSCOPICALLY   SURGEON:    Rexene Alberts, MD  ASSISTANTS:  Nani Skillern, PA-C  ANESTHESIA:  Hoy Morn, MD and Annye Asa, MD  CROSSCLAMP TIME:   58'  CARDIOPULMONARY BYPASS TIME: 47'  FINDINGS:  Normal LV systolic function   Good quality LIMA and RIMA conduit for grafting  Good quality SVG conduit for grafting  Good quality target vessels for grafting  COMPLICATIONS: None  BASELINE WEIGHT: 93 kg  PATIENT DISPOSITION:   TO SICU IN STABLE CONDITION  Rexene Alberts, MD 11/19/2015 6:16 PM

## 2015-11-19 NOTE — Anesthesia Procedure Notes (Signed)
Procedure Name: Intubation Date/Time: 11/19/2015 2:05 PM Performed by: Myna Bright Pre-anesthesia Checklist: Patient identified, Emergency Drugs available, Suction available and Patient being monitored Patient Re-evaluated:Patient Re-evaluated prior to inductionOxygen Delivery Method: Circle system utilized Preoxygenation: Pre-oxygenation with 100% oxygen Intubation Type: IV induction Ventilation: Mask ventilation without difficulty and Oral airway inserted - appropriate to patient size Laryngoscope Size: Mac and 4 Grade View: Grade I Tube type: Oral Tube size: 8.0 mm Number of attempts: 1 Airway Equipment and Method: Stylet Placement Confirmation: ETT inserted through vocal cords under direct vision,  positive ETCO2 and breath sounds checked- equal and bilateral Secured at: 23 cm Tube secured with: Tape Dental Injury: Teeth and Oropharynx as per pre-operative assessment

## 2015-11-19 NOTE — Op Note (Signed)
CARDIOTHORACIC SURGERY OPERATIVE NOTE  Date of Procedure: 11/19/2015  Preoperative Diagnosis:   Severe Left Main Coronary Artery Disease  S/P Acute Non-ST Segment Elevation Myocardial Infarction  Postoperative Diagnosis: Same  Procedure:    Coronary Artery Bypass Grafting x 3   Left Internal Mammary Artery to Distal Left Anterior Descending Coronary Artery  Right Internal Mammary Artery to Second Diagonal Branch Coronary Artery  Saphenous Vein Graft to Obtuse Marginal Branch of Left Circumflex Coronary Artery  Endoscopic Vein Harvest from Right Thigh  Surgeon: Valentina Gu. Roxy Manns, MD  Assistant: Nani Skillern, PA-C  Anesthesia: Hoy Morn, MD and Annye Asa, MD  Operative Findings:  Normal LV systolic function   Good quality LIMA and RIMA conduit for grafting  Good quality SVG conduit for grafting  Good quality target vessels for grafting     BRIEF CLINICAL NOTE AND INDICATIONS FOR SURGERY  Patient is a 60 yo previously healthy male with no previous h/o CAD and few risk factors other than newly discovered hypertension and borderline hyperlipidemia.  He presents with recent onset of classical symptoms of exertional angina approximately 6 weeks ago.  He has experienced a total of 5 or 6 episodes of chest discomfort during that period of time, always in the setting of significant physical exertion.  On the night of 11/16/2015 he developed substernal chest pain while laying in bed. He initially presented to [facility] emergency room where ECG was nondiagnostic. Troponin was elevated to 0.58.  He ruled in for a NSTEMI. He underwent a cardiac catheterization on 08/21 by Dr. Saralyn Pilar. Results showed a proximal 99% stenosis of the LAD, ostial circumflex to proximal circumflex 99% stenosis, and a 60% ostial left main stenosis.  He was transferred to [facility] for further management.  The patient has been seen in consultation and counseled at length regarding  the indications, risks and potential benefits of surgery.  All questions have been answered, and the patient provides full informed consent for the operation as described.     DETAILS OF THE OPERATIVE PROCEDURE  Preparation:  The patient is brought to the operating room on the above mentioned date and central monitoring was established by the anesthesia team including placement of Swan-Ganz catheter and radial arterial line. The patient is placed in the supine position on the operating table.  Intravenous antibiotics are administered. General endotracheal anesthesia is induced uneventfully. A Foley catheter is placed.  Baseline transesophageal echocardiogram was performed.  Findings were notable for mild LV systolic dysfunction.  The patient's chest, abdomen, both groins, and both lower extremities are prepared and draped in a sterile manner. A time out procedure is performed.   Surgical Approach and Conduit Harvest:  A median sternotomy incision was performed and the left and right internal mammary arteries were dissected from the chest wall and prepared for bypass grafting. The left and right internal mammary arteries were both notably good quality conduit. Simultaneously, the greater saphenous vein is obtained from the patient's right thigh using endoscopic vein harvest technique. The saphenous vein is notably good quality conduit. After removal of the saphenous vein, the small surgical incisions in the lower extremity are closed with absorbable suture. Following systemic heparinization, both internal mammary arteries were transected distally noted to have excellent flow.   Extracorporeal Cardiopulmonary Bypass and Myocardial Protection:  The pericardium is opened. The ascending aorta is normal in appearance. The ascending aorta and the right atrium are cannulated for cardiopulmonary bypass.  Adequate heparinization is verified.    The  entire pre-bypass portion of the operation was  notable for stable hemodynamics.  Cardiopulmonary bypass was begun and the surface of the heart is inspected. Distal target vessels are selected for coronary artery bypass grafting. A cardioplegia cannula is placed in the ascending aorta.  A temperature probe was placed in the interventricular septum.  The patient is allowed to cool passively to [facility] systemic temperature.  The aortic cross clamp is applied and cold blood cardioplegia is delivered initially in an antegrade fashion through the aortic root.  Iced saline slush is applied for topical hypothermia.  The initial cardioplegic arrest is rapid with early diastolic arrest.  Repeat doses of cardioplegia are administered intermittently throughout the entire cross clamp portion of the operation through the aortic root and through subsequently placed vein grafts in order to maintain completely flat electrocardiogram and septal myocardial temperature below 15C.  Myocardial protection was felt to be excellent.  Coronary Artery Bypass Grafting:   The first obtuse marginal branch of the left circumflex coronary artery was grafted using a reversed saphenous vein graft in an end-to-side fashion.  At the site of distal anastomosis the target vessel was good quality and measured approximately 2.0 mm in diameter.  The second diagonal branch of the left anterior descending coronary artery was grafted with the right internal mammary artery in an end-to-side fashion.  At the site of distal anastomosis the target vessel was good quality and measured approximately 2.0 mm in diameter.  The right internal mammary artery was utilized in-situ with the pedicle placed posterior to the aorta through the transverse sinus.  The distal left anterior coronary artery was grafted with the left internal mammary artery in an end-to-side fashion.  At the site of distal anastomosis the target vessel was good quality and measured approximately 2.0 mm in diameter.  All proximal  vein graft anastomoses were placed directly to the ascending aorta prior to removal of the aortic cross clamp.  The septal myocardial temperature rose rapidly after reperfusion of the left internal mammary artery graft.  The aortic cross clamp was removed after a total cross clamp time of 58 minutes.   Procedure Completion:  All proximal and distal coronary anastomoses were inspected for hemostasis and appropriate graft orientation. Epicardial pacing wires are fixed to the right ventricular outflow tract and to the right atrial appendage. The patient is rewarmed to 37C temperature. The patient is weaned and disconnected from cardiopulmonary bypass.  The patient's rhythm at separation from bypass was sinus.  The patient was weaned from cardiopulmonary bypass without any inotropic support. Total cardiopulmonary bypass time for the operation was 75 minutes.  Followup transesophageal echocardiogram performed after separation from bypass revealed normal LV systolic function, noticeably improved from the preoperative exam.  The aortic and venous cannula were removed uneventfully. Protamine was administered to reverse the anticoagulation. The mediastinum and pleural space were inspected for hemostasis and irrigated with saline solution. The mediastinum and both pleural spaces were drained using 4 chest tubes placed through separate stab incisions inferiorly.  The soft tissues anterior to the aorta were reapproximated loosely. The sternum is closed with double strength sternal wire. The soft tissues anterior to the sternum were closed in multiple layers and the skin is closed with a running subcuticular skin closure.  The post-bypass portion of the operation was notable for stable rhythm and hemodynamics.  No blood products were administered during the operation.   Disposition:  The patient tolerated the procedure well and is transported to the surgical intensive care  in stable condition. There are no  intraoperative complications. All sponge instrument and needle counts are verified correct at completion of the operation.  The case was considered contaminated because a fly landed on the surgical field during the surgery.    Valentina Gu. Roxy Manns MD 11/19/2015 6:17 PM

## 2015-11-19 NOTE — Anesthesia Postprocedure Evaluation (Signed)
Anesthesia Post Note  Patient: Clinton Lyons  Procedure(s) Performed: Procedure(s) (LRB): CORONARY ARTERY BYPASS GRAFTING (CABG), ON PUMP, TIMES THREE, USING BILATERAL INTERNAL MAMMARY ARTERIES, RIGHT GREATER SAPHENOUS VEIN HARVESTED ENDOSCOPICALLY (N/A) TRANSESOPHAGEAL ECHOCARDIOGRAM (TEE) (N/A)  Patient location during evaluation: SICU Anesthesia Type: General Level of consciousness: awake and patient cooperative Pain management: pain level controlled Vital Signs Assessment: post-procedure vital signs reviewed and stable Respiratory status: patient on ventilator - see flowsheet for VS and patient remains intubated per anesthesia plan Cardiovascular status: stable (still requiring phenylephrine) Postop Assessment: no signs of nausea or vomiting Anesthetic complications: no    Last Vitals:  Vitals:   11/19/15 2000 11/19/15 2015  BP: (!) 91/59   Pulse: (!) 110 (!) 109  Resp: (!) 22 (!) 28  Temp: 37.1 C 37.3 C    Last Pain:  Vitals:   11/19/15 0814  TempSrc: Oral  PainSc:                  Sutton Hirsch,E. Jori Frerichs

## 2015-11-19 NOTE — Anesthesia Preprocedure Evaluation (Addendum)
Anesthesia Evaluation  Patient identified by MRN, date of birth, ID band Patient awake    Reviewed: Allergy & Precautions, NPO status , Patient's Chart, lab work & pertinent test results, reviewed documented beta blocker date and time   History of Anesthesia Complications (+) PROLONGED EMERGENCENegative for: history of anesthetic complications  Airway Mallampati: II  TM Distance: >3 FB Neck ROM: Full    Dental  (+) Teeth Intact, Dental Advisory Given   Pulmonary neg pulmonary ROS,    Pulmonary exam normal breath sounds clear to auscultation       Cardiovascular hypertension, Pt. on home beta blockers + angina with exertion + CAD and + Past MI  Normal cardiovascular exam Rhythm:Regular Rate:Normal  Echo 11/16/15: Study Conclusions  - Left ventricle: The cavity size was normal. Systolic function was moderately reduced. The estimated ejection fraction was in the range of 35% to 40%. Akinesis of the inferoseptal myocardium. Hypokinesis of the apical myocardium. Akinesis of the inferior myocardium. - Aortic valve: Valve area (Vmax): 3.22 cm^2. - Mitral valve: There was mild regurgitation. - Left atrium: The atrium was mildly dilated.  LHC 11/16/15: Prox LAD lesion, 99 %stenosed. Ost LM lesion, 60 %stenosed. Ost Cx to Prox Cx lesion, 95 %stenosed.   1. Two-vessel coronary artery disease with 5060% stenosis distal left main, 99% stenosis proximal LAD, and 95% stenosis ostial left circumflex 2. Normal left ventricular function   Neuro/Psych negative neurological ROS  negative psych ROS   GI/Hepatic negative GI ROS, Neg liver ROS,   Endo/Other  negative endocrine ROS  Renal/GU negative Renal ROS     Musculoskeletal negative musculoskeletal ROS (+)   Abdominal   Peds  Hematology  (+) Blood dyscrasia (Thrombocytopenia), ,   Anesthesia Other Findings Day of surgery medications reviewed with the patient.   Reproductive/Obstetrics                            Anesthesia Physical Anesthesia Plan  ASA: IV  Anesthesia Plan: General   Post-op Pain Management:    Induction: Intravenous  Airway Management Planned: Oral ETT  Additional Equipment: Arterial line, TEE, PA Cath, CVP and Ultrasound Guidance Line Placement  Intra-op Plan:   Post-operative Plan: Post-operative intubation/ventilation  Informed Consent: I have reviewed the patients History and Physical, chart, labs and discussed the procedure including the risks, benefits and alternatives for the proposed anesthesia with the patient or authorized representative who has indicated his/her understanding and acceptance.   Dental advisory given  Plan Discussed with: CRNA  Anesthesia Plan Comments: (Risks/benefits of general anesthesia discussed with patient including risk of damage to teeth, lips, gum, and tongue, nausea/vomiting, allergic reactions to medications, and the possibility of heart attack, stroke and death.  All patient questions answered.  Patient wishes to proceed.)        Anesthesia Quick Evaluation

## 2015-11-19 NOTE — OR Nursing (Signed)
Twenty minute call to SICU charge nurse at [place]. Spoke to [place].

## 2015-11-19 NOTE — OR Nursing (Signed)
Forty-five minute call to SICU charge nurse at 1740. Spoke to [place].

## 2015-11-19 NOTE — Anesthesia Procedure Notes (Signed)
Central Venous Catheter Insertion Performed by: anesthesiologist Patient location: Pre-op. Preanesthetic checklist: patient identified, IV checked, site marked, risks and benefits discussed, surgical consent, monitors and equipment checked, pre-op evaluation, timeout performed and anesthesia consent Lidocaine 1% used for infiltration Landmarks identified Catheter size: 8.5 Fr PA cath and Central line was placed.Sheath introducer Swan type and PA catheter depth:thermodilution and 48PA Cath depth:48 Procedure performed using ultrasound guided technique. Attempts: 1 Following insertion, line sutured and dressing applied. Post procedure assessment: blood return through all ports, free fluid flow and no air. Patient tolerated the procedure well with no immediate complications.

## 2015-11-19 NOTE — Transfer of Care (Signed)
Immediate Anesthesia Transfer of Care Note  Patient: Clinton Lyons  Procedure(s) Performed: Procedure(s) with comments: CORONARY ARTERY BYPASS GRAFTING (CABG), ON PUMP, TIMES THREE, USING BILATERAL INTERNAL MAMMARY ARTERIES, RIGHT GREATER SAPHENOUS VEIN HARVESTED ENDOSCOPICALLY (N/A) - LIMA to LAD, RIMA to DIAGONAL 2, and SVG to OM1 TRANSESOPHAGEAL ECHOCARDIOGRAM (TEE) (N/A)  Patient Location: SICU  Anesthesia Type:General  Level of Consciousness: sedated and Patient remains intubated per anesthesia plan  Airway & Oxygen Therapy: Patient remains intubated per anesthesia plan and Patient placed on Ventilator (see vital sign flow sheet for setting)  Post-op Assessment: Report given to RN and Post -op Vital signs reviewed and stable  Post vital signs: Reviewed and stable  Last Vitals:  Vitals:   11/19/15 0814 11/19/15 0815  BP: (!) 123/93 (!) 123/93  Pulse: 91   Resp:    Temp: 37.2 C     Last Pain:  Vitals:   11/19/15 0814  TempSrc: Oral  PainSc:          Complications: No apparent anesthesia complications   Pt VSS during transport to ICU. Pt met my ICU RN and RT. Report given to RN/RT and all questions answered. Pt connected to ICU monitors and continued stability confirmed.   BP 771 systolic, HR 90, SPO2 165% upon leaving ICU.

## 2015-11-20 ENCOUNTER — Inpatient Hospital Stay (HOSPITAL_COMMUNITY): Payer: BLUE CROSS/BLUE SHIELD

## 2015-11-20 ENCOUNTER — Encounter (HOSPITAL_COMMUNITY): Payer: Self-pay | Admitting: Thoracic Surgery (Cardiothoracic Vascular Surgery)

## 2015-11-20 LAB — BASIC METABOLIC PANEL
Anion gap: 6 (ref 5–15)
BUN: 7 mg/dL (ref 6–20)
CALCIUM: 7.5 mg/dL — AB (ref 8.9–10.3)
CHLORIDE: 108 mmol/L (ref 101–111)
CO2: 23 mmol/L (ref 22–32)
CREATININE: 0.79 mg/dL (ref 0.61–1.24)
GFR calc Af Amer: >60 mL/min (ref >60)
GFR calc non Af Amer: >60 mL/min (ref >60)
Glucose, Bld: 124 mg/dL — ABNORMAL HIGH (ref 65–99)
Potassium: 4.3 mmol/L (ref 3.5–5.1)
SODIUM: 137 mmol/L (ref 135–145)

## 2015-11-20 LAB — CBC
HCT: 29.4 % — ABNORMAL LOW (ref 39.0–52.0)
HEMATOCRIT: 32.7 % — AB (ref 39.0–52.0)
HEMATOCRIT: 29.3 % — AB (ref 39.0–52.0)
HEMOGLOBIN: 9.8 g/dL — AB (ref 13.0–17.0)
HEMOGLOBIN: 9.9 g/dL — AB (ref 13.0–17.0)
Hemoglobin: 11.2 g/dL — ABNORMAL LOW (ref 13.0–17.0)
MCH: 31.2 pg (ref 26.0–34.0)
MCH: 31.3 pg (ref 26.0–34.0)
MCH: 31.3 pg (ref 26.0–34.0)
MCHC: 34.3 g/dL (ref 30.0–36.0)
MCHC: 33.3 g/dL (ref 30.0–36.0)
MCHC: 33.8 g/dL (ref 30.0–36.0)
MCV: 91.1 fL (ref 78.0–100.0)
MCV: 93.9 fL (ref 78.0–100.0)
MCV: 92.7 fL (ref 78.0–100.0)
Platelets: 83 10*3/uL — ABNORMAL LOW (ref 150–400)
Platelets: 88 10*3/uL — ABNORMAL LOW (ref 150–400)
Platelets: 104 10*3/uL — ABNORMAL LOW (ref 150–400)
RBC: 3.59 MIL/uL — ABNORMAL LOW (ref 4.22–5.81)
RBC: 3.13 MIL/uL — ABNORMAL LOW (ref 4.22–5.81)
RBC: 3.16 MIL/uL — ABNORMAL LOW (ref 4.22–5.81)
RDW: 12.6 % (ref 11.5–15.5)
RDW: 12.9 % (ref 11.5–15.5)
RDW: 12.9 % (ref 11.5–15.5)
WBC: 9 10*3/uL (ref 4.0–10.5)
WBC: 7.1 10*3/uL (ref 4.0–10.5)
WBC: 11.6 10*3/uL — ABNORMAL HIGH (ref 4.0–10.5)

## 2015-11-20 LAB — POCT I-STAT 3, ART BLOOD GAS (G3+)
BICARBONATE: 24.6 meq/L — AB (ref 20.0–24.0)
BICARBONATE: 24.3 meq/L — AB (ref 20.0–24.0)
O2 Saturation: 94 %
O2 Saturation: 94 %
PH ART: 7.41 (ref 7.350–7.450)
Pressure control: 42 cmH2O
TCO2: 26 mmol/L (ref 0–100)
TCO2: 25 mmol/L (ref 0–100)
pCO2 arterial: 40.4 mmHg (ref 35.0–45.0)
pCO2 arterial: 38.7 mmHg (ref 35.0–45.0)
pH, Arterial: 7.395 (ref 7.350–7.450)
pO2, Arterial: 72 mmHg — ABNORMAL LOW (ref 80.0–100.0)
pO2, Arterial: 75 mmHg — ABNORMAL LOW (ref 80.0–100.0)

## 2015-11-20 LAB — GLUCOSE, CAPILLARY
GLUCOSE-CAPILLARY: 102 mg/dL — AB (ref 65–99)
GLUCOSE-CAPILLARY: 103 mg/dL — AB (ref 65–99)
GLUCOSE-CAPILLARY: 104 mg/dL — AB (ref 65–99)
GLUCOSE-CAPILLARY: 131 mg/dL — AB (ref 65–99)
GLUCOSE-CAPILLARY: 87 mg/dL (ref 65–99)
Glucose-Capillary: 110 mg/dL — ABNORMAL HIGH (ref 65–99)
Glucose-Capillary: 113 mg/dL — ABNORMAL HIGH (ref 65–99)
Glucose-Capillary: 103 mg/dL — ABNORMAL HIGH (ref 65–99)
Glucose-Capillary: 105 mg/dL — ABNORMAL HIGH (ref 65–99)
Glucose-Capillary: 102 mg/dL — ABNORMAL HIGH (ref 65–99)
Glucose-Capillary: 157 mg/dL — ABNORMAL HIGH (ref 65–99)

## 2015-11-20 LAB — POCT I-STAT, CHEM 8
BUN: 8 mg/dL (ref 6–20)
CALCIUM ION: 1.17 mmol/L (ref 1.12–1.23)
CHLORIDE: 102 mmol/L (ref 101–111)
CREATININE: 0.8 mg/dL (ref 0.61–1.24)
GLUCOSE: 130 mg/dL — AB (ref 65–99)
HCT: 28 % — ABNORMAL LOW (ref 39.0–52.0)
HEMOGLOBIN: 9.5 g/dL — AB (ref 13.0–17.0)
POTASSIUM: 3.9 mmol/L (ref 3.5–5.1)
Sodium: 137 mmol/L (ref 135–145)
TCO2: 22 mmol/L (ref 0–100)

## 2015-11-20 LAB — CREATININE, SERUM
Creatinine, Ser: 0.88 mg/dL (ref 0.61–1.24)
GFR calc non Af Amer: >60 mL/min (ref >60)

## 2015-11-20 LAB — MAGNESIUM
MAGNESIUM: 2.2 mg/dL (ref 1.7–2.4)
Magnesium: 2 mg/dL (ref 1.7–2.4)

## 2015-11-20 MED ORDER — CHLORHEXIDINE GLUCONATE 0.12 % MT SOLN
15.0000 mL | Freq: Two times a day (BID) | OROMUCOSAL | Status: DC
  Administered 2015-11-20 – 2015-11-21 (×2): 15 mL via OROMUCOSAL
  Filled 2015-11-20 (×2): qty 15

## 2015-11-20 MED ORDER — LACTATED RINGERS IV SOLN: INTRAVENOUS | Status: DC

## 2015-11-20 MED ORDER — INSULIN ASPART 100 UNIT/ML ~~LOC~~ SOLN
0.0000 [IU] | SUBCUTANEOUS | Status: DC
  Administered 2015-11-20 – 2015-11-21 (×3): 2 [IU] via SUBCUTANEOUS

## 2015-11-20 MED ORDER — AMIODARONE HCL IN DEXTROSE 360-4.14 MG/200ML-% IV SOLN
30.0000 mg/h | INTRAVENOUS | Status: DC
  Administered 2015-11-21 (×3): 30 mg/h via INTRAVENOUS
  Filled 2015-11-20: qty 200

## 2015-11-20 MED ORDER — INSULIN ASPART 100 UNIT/ML ~~LOC~~ SOLN: 0.0000 [IU] | SUBCUTANEOUS | Status: DC

## 2015-11-20 MED ORDER — AMIODARONE LOAD VIA INFUSION
150.0000 mg | Freq: Once | INTRAVENOUS | Status: AC
Start: 2015-11-20 — End: 2015-11-20
  Administered 2015-11-20: 150 mg via INTRAVENOUS

## 2015-11-20 MED ORDER — LATANOPROST 0.005 % OP SOLN
1.0000 [drp] | Freq: Every day | OPHTHALMIC | Status: DC
  Administered 2015-11-20 – 2015-11-23 (×4): 1 [drp] via OPHTHALMIC
  Filled 2015-11-20: qty 2.5

## 2015-11-20 MED ORDER — ATORVASTATIN CALCIUM 40 MG PO TABS
40.0000 mg | ORAL_TABLET | Freq: Every day | ORAL | Status: DC
  Administered 2015-11-21 – 2015-11-23 (×3): 40 mg via ORAL
  Filled 2015-11-20 (×3): qty 1

## 2015-11-20 MED ORDER — MORPHINE SULFATE (PF) 2 MG/ML IV SOLN
1.0000 mg | INTRAVENOUS | Status: DC | PRN
  Administered 2015-11-20: 2 mg via INTRAVENOUS
  Filled 2015-11-20: qty 1

## 2015-11-20 MED ORDER — AMIODARONE HCL IN DEXTROSE 360-4.14 MG/200ML-% IV SOLN
INTRAVENOUS | Status: AC
  Administered 2015-11-20: 150 mg via INTRAVENOUS
  Filled 2015-11-20: qty 200

## 2015-11-20 MED ORDER — AMIODARONE HCL IN DEXTROSE 360-4.14 MG/200ML-% IV SOLN
60.0000 mg/h | INTRAVENOUS | Status: AC
  Administered 2015-11-20: 60 mg/h via INTRAVENOUS
  Filled 2015-11-20: qty 200

## 2015-11-20 MED ORDER — ORAL CARE MOUTH RINSE
15.0000 mL | Freq: Two times a day (BID) | OROMUCOSAL | Status: DC
  Administered 2015-11-21 (×2): 15 mL via OROMUCOSAL

## 2015-11-20 MED FILL — Sodium Bicarbonate IV Soln 8.4%: INTRAVENOUS | Qty: 50 | Status: AC

## 2015-11-20 MED FILL — Electrolyte-R (PH 7.4) Solution: INTRAVENOUS | Qty: 3000 | Status: AC

## 2015-11-20 MED FILL — Mannitol IV Soln 20%: INTRAVENOUS | Qty: 500 | Status: AC

## 2015-11-20 MED FILL — Lidocaine HCl IV Inj 20 MG/ML: INTRAVENOUS | Qty: 5 | Status: AC

## 2015-11-20 MED FILL — Heparin Sodium (Porcine) Inj 1000 Unit/ML: INTRAMUSCULAR | Qty: 20 | Status: AC

## 2015-11-20 MED FILL — Sodium Chloride IV Soln 0.9%: INTRAVENOUS | Qty: 2000 | Status: AC

## 2015-11-20 NOTE — Progress Notes (Signed)
Patient ID: Clinton Lyons, male   DOB: March 15, 1956, 60 y.o.   MRN: [id number]  SICU Evening Rounds:  Hemodynamically stable on low dose neo  CT output decreased   Urine output good.  BMET    Component Value Date/Time   NA 137 11/20/2015 1545   K 3.9 11/20/2015 1545   CL 102 11/20/2015 1545   CO2 23 11/20/2015 0330   GLUCOSE 130 (H) 11/20/2015 1545   BUN 8 11/20/2015 1545   CREATININE 0.80 11/20/2015 1545   CALCIUM 7.5 (L) 11/20/2015 0330   GFRNONAA >60 11/20/2015 1530   GFRAA >60 11/20/2015 1530   CBC    Component Value Date/Time   WBC 7.1 11/20/2015 0330   RBC 3.13 (L) 11/20/2015 0330   HGB 9.5 (L) 11/20/2015 1545   HCT 28.0 (L) 11/20/2015 1545   PLT 88 (L) 11/20/2015 0330   MCV 93.9 11/20/2015 0330   MCH 31.3 11/20/2015 0330   MCHC 33.3 11/20/2015 0330   RDW 12.9 11/20/2015 0330    A/P: Doing well. Wean neo as tolerated. Remove chest tubes.

## 2015-11-20 NOTE — Discharge Summary (Cosign Needed)
Dictation #1 [id number]  [id number] Physician Discharge Summary       [address]       [place] [address]             [phone]    Patient ID: Clinton Lyons MRN: [id number] DOB/AGE: 62-12-1955 62 y.o.  Admit date: 11/17/2015 Discharge date: 11/24/2015  Admission Diagnoses: 1. Unstable angina ([place]) 2. S/p NSTEMI (non-ST elevated myocardial infarction) ([place]) 3. Coronary artery disease  Active Diagnoses:  1. Essential hypertension 2. Borderline hyplerlipidemia 3. Glaucoma 4. Post op a fib (converted to SR)  Procedure (s):  Left Heart Cath and Coronary Angiography by Dr. Saralyn Pilar on 11/16/2015:  Conclusion     Prox LAD lesion, 99 %stenosed.  Ost LM lesion, 60 %stenosed.  Ost Cx to Prox Cx lesion, 95 %stenosed.   1. Two-vessel coronary artery disease with 5060% stenosis distal left main, 99% stenosis proximal LAD, and 95% stenosis ostial left circumflex 2. Normal left ventricular function     Coronary Artery Bypass Grafting x 3                        Left Internal Mammary Artery to Distal Left Anterior Descending Coronary Artery                       Right Internal Mammary Artery to Second Diagonal Branch Coronary Artery                       Saphenous Vein Graft to Obtuse Marginal Branch of Left Circumflex Coronary Artery                       Endoscopic Vein Harvest from Right Thigh by Dr. Roxy Manns on 11/19/2015.  History of Presenting Illness: This is a 60 year old male  who was transferred from [facility] for consideration of coronary artery bypass grafting surgery. The patient recently moved to [place] from [place]. He reports two-month history of chest pain. He developed substernal chest tightness with radiation to his left arm with exertion, with such activities as lifting furniture, walking up steps or up hill. He was in his usual state of health until the night of 11/16/2015 when he developed substernal  chest pain while laying in bed. He initially presented to [facility] emergency room where ECG was nondiagnostic. Troponin was elevated to 0.58. He ruled in for a NSTEMI. He underwent a cardiac catheterization on 08/21 by Dr. Saralyn Pilar. Results showed a proximal 99% stenosis of the LAD, ostial circumflex to proximal circumflex 99% stenosis, and a 60% ostial left main stenosis. At the time of exam, he denied chest pain or shortness of breath. Dr. Roxy Manns saw and examined the patient. Dr. Roxy Manns discussed the need for coronary artery bypass grafting surgery. Potential risks, benefits, and complications were discussed with the patient and he agreed to proceed with surgery. Pre operative carotid duplex US showed no significant bilateral internal carotid artery stenosis. He underwent a CABG x 3 on 11/19/2015.  Brief Hospital Course:  The patient was extubated the morning of post operative day one without difficulty. He/she remained afebrile and hemodynamically stable. He was weaned off of Neo Synephrine drip. Gordy Councilman, a line, chest tubes, and foley were removed early in the post operative course. Lopressor was started and titrated accordingly. He went into a fib and was put on Amiodarone. He converted and remained in SR. He was volume  over loaded and diuresed. He had ABL anemia. He did not require a post op transfusion. His last H and H was stable at 9.9 and 30.1. He also had mild thrombocytopenia. His last platelet count was 100,000. He was weaned off the insulin drip. The patient's HGA1C pre op was 5.4. The patient was felt surgically stable for transfer from the ICU to [facility] for further convalescence on 11/22/2015. He continues to progress with cardiac rehab. He was ambulating on room air. He has been tolerating a diet and has had a bowel movement. Epicardial pacing wires were removed on 08/28. Chest tube sutures will be removed in the office after discharge. The patient is felt surgically stable for discharge  today.   Latest Vital Signs: Blood pressure (!) 154/87, pulse (!) 109, temperature 98.7 F (37.1 C), temperature source Oral, resp. rate 18, height 5\' 10"  (1.778 m), weight 209 lb 9.6 oz (95.1 kg), SpO2 95 %.  Physical Exam: Rhythm:                                           sinus                       Breath sounds:                      clear                       Heart sounds:                                  RRR                       Incisions:                                         Dressings dry, intact                       Abdomen:                                        Soft, non-distended, non-tender                       Extremities:                                     Warm, well-perfused                        Discharge Condition:Stable and discharged to home.  Recent laboratory studies:  Lab Results  Component Value Date   WBC 12.7 (H) 11/21/2015   HGB 9.9 (L) 11/21/2015   HCT 30.1 (L) 11/21/2015   MCV 94.1 11/21/2015   PLT 100 (L) 11/21/2015   Lab Results  Component Value Date   NA 137 11/22/2015   K 3.9 11/22/2015   CL 105 11/22/2015   CO2 27  11/22/2015   CREATININE 0.83 11/22/2015   GLUCOSE 101 (H) 11/22/2015    Diagnostic Studies: Dg Chest 2 View  Result Date: 11/15/2015 CLINICAL DATA:  Chest discomfort EXAM: CHEST  2 VIEW COMPARISON:  None. FINDINGS: The heart size and mediastinal contours are within normal limits. Both lungs are clear. The visualized skeletal structures are unremarkable. IMPRESSION: No active cardiopulmonary disease. Electronically Signed   By: Dorise Bullion III M.D   On: 11/15/2015 15:23    Discharge Medications:   Medication List    STOP taking these medications   aspirin 81 MG chewable tablet Replaced by:  aspirin 325 MG EC tablet     TAKE these medications   acetaminophen 325 MG tablet Commonly known as:  TYLENOL Take 2 tablets (650 mg total) by mouth every 6 (six) hours as needed for mild pain.   amiodarone 200 MG  tablet Commonly known as:  PACERONE Take 1 tablet (200 mg total) by mouth daily.   aspirin 325 MG EC tablet Take 1 tablet (325 mg total) by mouth daily. Replaces:  aspirin 81 MG chewable tablet   atorvastatin 40 MG tablet Commonly known as:  LIPITOR Take 1 tablet (40 mg total) by mouth daily at 6 PM.   glucosamine-chondroitin 500-400 MG tablet Take 1 tablet by mouth every morning.   latanoprost 0.005 % ophthalmic solution Commonly known as:  XALATAN Place 1 drop into the right eye at bedtime.   lisinopril 10 MG tablet Commonly known as:  PRINIVIL,ZESTRIL Take 1 tablet (10 mg total) by mouth daily.   Metoprolol Tartrate 75 MG Tabs Take 75 mg by mouth 2 (two) times daily.   PX ADVANCED FORMULA MULTIVITS Tabs Take 1 tablet by mouth every morning.   traMADol 50 MG tablet Commonly known as:  ULTRAM Take 1 tablet (50 mg total) by mouth every 4 (four) hours as needed for moderate pain.   Vitamin D3 2000 units capsule Take 4,000 Units by mouth every morning.      The patient has been discharged on:   1.Beta Blocker:  Yes [ x  ]                              No   [   ]                              If No, reason:  2.Ace Inhibitor/ARB: Yes [  x ]                                     No  [    ]                                     If No, reason:  3.Statin:   Yes [  x ]                  No  [   ]                  If No, reason:  4.Ecasa:  Yes  [ x  ]                  No   [   ]  If No, reason:  Follow Up Appointments: Follow-up Information    PARASCHOS,ALEXANDER, MD .   Specialty:  Cardiology Why:  Call for a follow up appointment for 2 weeks Contact information: [address] [facility] [place] [address] [phone]        Rexene Alberts, MD Follow up on 12/21/2015.   Specialty:  Cardiothoracic Surgery Why:  PA/LAT CXR to be taken (at [facility] which is in the same building as Dr. Guy Sandifer office) on  12/28/2015 at 11:30 am ;Appointment time is at 12:00 pm Contact information: [address] [place] [address] [phone]        Nurse Follow up on 12/03/2015.   Why:  Appointment is with nurse only to have chest tube sutures removed. Appointment time is at 10:00 am Contact information: [place] [address]          Signed: Lars Pinks MPA-C 11/24/2015, 8:28 AM

## 2015-11-20 NOTE — Procedures (Signed)
Extubation Procedure Note  Patient Details:   Name: Clinton Lyons DOB: [birth year] MRN: [id number]   Airway Documentation:  Airway 8 mm (Active)  Secured at (cm) 24 cm 11/20/2015  3:19 AM  Measured From Lips 11/20/2015  3:19 AM  Secured Location Right 11/20/2015  3:19 AM  Secured By Rana Snare Tape 11/20/2015  3:19 AM  Cuff Pressure (cm H2O) 22 cm H2O 11/20/2015  3:19 AM  Site Condition Dry 11/19/2015  7:03 PM    Evaluation  O2 sats: stable throughout and currently acceptable Complications: No apparent complications Patient did tolerate procedure well. Bilateral Breath Sounds: Clear, Diminished   Yes  [name] 11/20/2015, 7:47 AM

## 2015-11-20 NOTE — Progress Notes (Signed)
[address]       [place] [address]             [phone]        CARDIOTHORACIC SURGERY PROGRESS NOTE   R1 Day Post-Op Procedure(s) (LRB): CORONARY ARTERY BYPASS GRAFTING (CABG), ON PUMP, TIMES THREE, USING BILATERAL INTERNAL MAMMARY ARTERIES, RIGHT GREATER SAPHENOUS VEIN HARVESTED ENDOSCOPICALLY (N/A) TRANSESOPHAGEAL ECHOCARDIOGRAM (TEE) (N/A)  Subjective: Looks good and feels well.  Mild soreness in chest.  Denies SOB.  Objective: Vital signs: BP Readings from Last 1 Encounters:  11/20/15 98/62   Pulse Readings from Last 1 Encounters:  11/20/15 (!) 102   Resp Readings from Last 1 Encounters:  11/20/15 (!) 30   Temp Readings from Last 1 Encounters:  11/20/15 (!) 101.7 F (38.7 C)    Hemodynamics: PAP: (22-57)/(1-22) 31/15 CO:  [3.7 L/min-6.2 L/min] 4.7 L/min CI:  [1.8 L/min/m2-2.9 L/min/m2] 2.2 L/min/m2  Physical Exam:  Rhythm:   sinus  Breath sounds: clear  Heart sounds:  RRR  Incisions:  Dressings dry, intact  Abdomen:  Soft, non-distended, non-tender  Extremities:  Warm, well-perfused  Chest tubes:  decreasing volume thin serosanguinous output, no air leak    Intake/Output from previous day: 08/24 0701 - 08/25 0700 In: 6532.2 [I.V.:3885.2; Blood:417; NG/GT:180; IV [id number] Out: [id number] [Urine:2765; Emesis/NG output:230; Blood:820; Chest Tube:630] Intake/Output this shift: Total I/O In: 104.1 [I.V.:104.1] Out: 80 [Urine:40; Chest Tube:40]  Lab Results:  CBC: Recent Labs  11/19/15 1904 11/20/15 0330  WBC 9.0 7.1  HGB 11.2* 9.8*  HCT 32.7* 29.4*  PLT 83* 88*    BMET:  Recent Labs  11/19/15 0445  11/19/15 1735 11/19/15 1902 11/20/15 0330  NA 137  < > 135 140 137  K 4.1  < > 4.3 3.8 4.3  CL 104  < > 97*  --  108  CO2 27  --   --   --  23  GLUCOSE 108*  < > 127* 114* 124*  BUN 8  < > 9  --  7  CREATININE 0.97  < > 0.80  --  0.79  CALCIUM 9.0  --   --   --  7.5*  < > = values in this interval not  displayed.   PT/INR:   Recent Labs  11/19/15 1904  LABPROT 17.9*  INR 1.47    CBG (last 3)   Recent Labs  11/20/15 0601 11/20/15 0655 11/20/15 0733  GLUCAP 103* 105* 87    ABG    Component Value Date/Time   PHART 7.410 11/20/2015 0725   PCO2ART 38.7 11/20/2015 0725   PO2ART 75.0 (L) 11/20/2015 0725   HCO3 24.3 (H) 11/20/2015 0725   TCO2 25 11/20/2015 0725   ACIDBASEDEF 2.0 11/19/2015 1939   O2SAT 94.0 11/20/2015 0725    CXR: PORTABLE CHEST 1 VIEW  COMPARISON:  November 19, 2015  FINDINGS: Endotracheal tube tip is 6.4 cm above the carina. Swan-Ganz catheter tip is in the right main pulmonary artery. There are bilateral chest tubes and a mediastinal drain. Temporary pacemaker wires are attached to the right heart. No pneumothorax. There is bibasilar atelectatic change. Lungs elsewhere clear. Heart is borderline enlarged, stable. The pulmonary vascularity is normal. No adenopathy.  IMPRESSION: Tube and catheter positions as described without pneumothorax. Patchy bibasilar atelectasis. Lungs elsewhere clear. Stable cardiac silhouette.   Electronically Signed   By: Lowella Grip III M.D.   On: 11/20/2015 07:07   EKG: NSR w/out acute ischemic changes  Assessment/Plan: S/P Procedure(s) (LRB): CORONARY ARTERY BYPASS GRAFTING (CABG), ON PUMP, TIMES THREE, USING BILATERAL INTERNAL MAMMARY ARTERIES, RIGHT GREATER SAPHENOUS VEIN HARVESTED ENDOSCOPICALLY (N/A) TRANSESOPHAGEAL ECHOCARDIOGRAM (TEE) (N/A)  Doing well POD1 Maintaining NSR w/ stable hemodynamics, vasodilated and still on Neo for BP support Breathing comfortably w/ O2 sats 97% on 4 L/min Expected post op atelectasis, mild Expected post op acute blood loss anemia, very mild Expected post op volume excess, mild, PA pressures currently low   Mobilize  D/C lines  Wean Neo as tolerated  D/C tubes later today or tomorrow depending on output  Hold diuretics until BP increases off  Neo   Rexene Alberts, MD 11/20/2015 8:56 AM

## 2015-11-20 NOTE — Discharge Instructions (Signed)
Coronary Artery Bypass Grafting, Care After °Refer to this sheet in the next few weeks. These instructions provide you with information on caring for yourself after your procedure. Your health care provider may also give you more specific instructions. Your treatment has been planned according to current medical practices, but problems sometimes occur. Call your health care provider if you have any problems or questions after your procedure. °WHAT TO EXPECT AFTER THE PROCEDURE °Recovery from surgery will be different for everyone. Some people feel well after 3 or 4 weeks, while for others it takes longer. After your procedure, it is typical to have the following: °· Nausea and a lack of appetite.   °· Constipation. °· Weakness and fatigue.   °· Depression or irritability.   °· Pain or discomfort at your incision site. °HOME CARE INSTRUCTIONS °· Take medicines only as directed by your health care provider. Do not stop taking medicines or start any new medicines without first checking with your health care provider. °· Take your pulse as directed by your health care provider. °· Perform deep breathing as directed by your health care provider. If you were given a device called an incentive spirometer, use it to practice deep breathing several times a day. Support your chest with a pillow or your arms when you take deep breaths or cough. °· Keep incision areas clean, dry, and protected. Remove or change any bandages (dressings) only as directed by your health care provider. You may have skin adhesive strips over the incision areas. Do not take the strips off. They will fall off on their own. °· Check incision areas daily for any swelling, redness, or drainage. °· If incisions were made in your legs, do the following: °¨ Avoid crossing your legs.   °¨ Avoid sitting for long periods of time. Change positions every 30 minutes.   °¨ Elevate your legs when you are sitting. °· Wear compression stockings as directed by your  health care provider. These stockings help keep blood clots from forming in your legs. °· Take showers once your health care provider approves. Until then, only take sponge baths. Pat incisions dry. Do not rub incisions with a washcloth or towel. Do not take baths, swim, or use a hot tub until your health care provider approves. °· Eat foods that are high in fiber, such as raw fruits and vegetables, whole grains, beans, and nuts. Meats should be lean cut. Avoid canned, processed, and fried foods. °· Drink enough fluid to keep your urine clear or pale yellow. °· Weigh yourself every day. This helps identify if you are retaining fluid that may make your heart and lungs work harder. °· Rest and limit activity as directed by your health care provider. You may be instructed to: °¨ Stop any activity at once if you have chest pain, shortness of breath, irregular heartbeats, or dizziness. Get help right away if you have any of these symptoms. °¨ Move around frequently for short periods or take short walks as directed by your health care provider. Increase your activities gradually. You may need physical therapy or cardiac rehabilitation to help strengthen your muscles and build your endurance. °¨ Avoid lifting, pushing, or pulling anything heavier than 10 lb (4.5 kg) for at least 6 weeks after surgery. °· Do not drive until your health care provider approves.  °· Ask your health care provider when you may return to work. °· Ask your health care provider when you may resume sexual activity. °· Keep all follow-up visits as directed by your health care   provider. This is important. °SEEK MEDICAL CARE IF: °· You have swelling, redness, increasing pain, or drainage at the site of an incision. °· You have a fever. °· You have swelling in your ankles or legs. °· You have pain in your legs.   °· You gain 2 or more pounds (0.9 kg) a day. °· You are nauseous or vomit. °· You have diarrhea.  °SEEK IMMEDIATE MEDICAL CARE IF: °· You have  chest pain that goes to your jaw or arms. °· You have shortness of breath.   °· You have a fast or irregular heartbeat.   °· You notice a "clicking" in your breastbone (sternum) when you move.   °· You have numbness or weakness in your arms or legs. °· You feel dizzy or light-headed.   °MAKE SURE YOU: °· Understand these instructions. °· Will watch your condition. °· Will get help right away if you are not doing well or get worse. °  °This information is not intended to replace advice given to you by your health care provider. Make sure you discuss any questions you have with your health care provider. °  °Document Released: 10/01/2004 Document Revised: 04/04/2014 Document Reviewed: 08/21/2012 °Elsevier Interactive Patient Education ©2016 [employer] ° °

## 2015-11-20 NOTE — Progress Notes (Signed)
Patient converted to A-fib shortly after mobilization to chair @ 18:30.  Made contact with Dr. Cyndia Bent and obtained orders.  Patient converted back to ST at 19:03 after completion of Amiodarone bolus ordered by Dr. Cyndia Bent.  Richardean Canal RN, BSN, CCRN

## 2015-11-21 ENCOUNTER — Inpatient Hospital Stay (HOSPITAL_COMMUNITY): Payer: BLUE CROSS/BLUE SHIELD

## 2015-11-21 LAB — GLUCOSE, CAPILLARY
GLUCOSE-CAPILLARY: 134 mg/dL — AB (ref 65–99)
GLUCOSE-CAPILLARY: 120 mg/dL — AB (ref 65–99)
GLUCOSE-CAPILLARY: 93 mg/dL (ref 65–99)
GLUCOSE-CAPILLARY: 105 mg/dL — AB (ref 65–99)
Glucose-Capillary: 117 mg/dL — ABNORMAL HIGH (ref 65–99)
Glucose-Capillary: 84 mg/dL (ref 65–99)
Glucose-Capillary: 97 mg/dL (ref 65–99)

## 2015-11-21 LAB — BASIC METABOLIC PANEL
ANION GAP: 5 (ref 5–15)
BUN: 8 mg/dL (ref 6–20)
CHLORIDE: 103 mmol/L (ref 101–111)
CO2: 26 mmol/L (ref 22–32)
Calcium: 8.2 mg/dL — ABNORMAL LOW (ref 8.9–10.3)
Creatinine, Ser: 0.79 mg/dL (ref 0.61–1.24)
GFR calc non Af Amer: >60 mL/min (ref >60)
Glucose, Bld: 146 mg/dL — ABNORMAL HIGH (ref 65–99)
POTASSIUM: 3.9 mmol/L (ref 3.5–5.1)
Sodium: 134 mmol/L — ABNORMAL LOW (ref 135–145)

## 2015-11-21 LAB — CBC
HCT: 30.1 % — ABNORMAL LOW (ref 39.0–52.0)
Hemoglobin: 9.9 g/dL — ABNORMAL LOW (ref 13.0–17.0)
MCH: 30.9 pg (ref 26.0–34.0)
MCHC: 32.9 g/dL (ref 30.0–36.0)
MCV: 94.1 fL (ref 78.0–100.0)
PLATELETS: 100 10*3/uL — AB (ref 150–400)
RBC: 3.2 MIL/uL — AB (ref 4.22–5.81)
RDW: 13.1 % (ref 11.5–15.5)
WBC: 12.7 10*3/uL — AB (ref 4.0–10.5)

## 2015-11-21 MED ORDER — AMIODARONE HCL 200 MG PO TABS
400.0000 mg | ORAL_TABLET | Freq: Two times a day (BID) | ORAL | Status: DC
  Administered 2015-11-21 – 2015-11-23 (×6): 400 mg via ORAL
  Filled 2015-11-21 (×6): qty 2

## 2015-11-21 MED ORDER — POTASSIUM CHLORIDE 10 MEQ/50ML IV SOLN
10.0000 meq | INTRAVENOUS | Status: AC
  Administered 2015-11-21 (×3): 10 meq via INTRAVENOUS
  Filled 2015-11-21: qty 50

## 2015-11-21 MED ORDER — ORAL CARE MOUTH RINSE
15.0000 mL | Freq: Two times a day (BID) | OROMUCOSAL | Status: DC
  Administered 2015-11-21 – 2015-11-22 (×2): 15 mL via OROMUCOSAL

## 2015-11-21 MED ORDER — FUROSEMIDE 10 MG/ML IJ SOLN
40.0000 mg | Freq: Two times a day (BID) | INTRAMUSCULAR | Status: AC
  Administered 2015-11-21 (×2): 40 mg via INTRAVENOUS
  Filled 2015-11-21 (×2): qty 4

## 2015-11-21 NOTE — Progress Notes (Signed)
2 Days Post-Op Procedure(s) (LRB): CORONARY ARTERY BYPASS GRAFTING (CABG), ON PUMP, TIMES THREE, USING BILATERAL INTERNAL MAMMARY ARTERIES, RIGHT GREATER SAPHENOUS VEIN HARVESTED ENDOSCOPICALLY (N/A) TRANSESOPHAGEAL ECHOCARDIOGRAM (TEE) (N/A) Subjective:  No specific complaints  Objective: Vital signs in last 24 hours: Temp:  [97.6 F (36.4 C)-100.5 F (38.1 C)] 97.8 F (36.6 C) (08/26 0913) Pulse Rate:  [86-116] 93 (08/26 0700) Cardiac Rhythm: Normal sinus rhythm (08/26 0800) Resp:  [17-37] 21 (08/26 0700) BP: (82-128)/(63-88) 109/73 (08/26 0700) SpO2:  [92 %-100 %] 97 % (08/26 0700) Weight:  [102.1 kg (225 lb)] 102.1 kg (225 lb) (08/26 0600)  Hemodynamic parameters for last 24 hours:    Intake/Output from previous day: 08/25 0701 - 08/26 0700 In: 1106.4 [P.O.:120; I.V.:886.4; IV Piggyback:100] Out: 1000 [Urine:770; Chest Tube:230] Intake/Output this shift: Total I/O In: 42.7 [I.V.:42.7] Out: 45 [Urine:45]  General appearance: alert and cooperative Neurologic: intact Heart: regular rate and rhythm, S1, S2 normal, no murmur, click, rub or gallop Lungs: clear to auscultation bilaterally Extremities: edema mild Wound: dressings dry  Lab Results:  Recent Labs  11/20/15 1930 11/21/15 0405  WBC 11.6* 12.7*  HGB 9.9* 9.9*  HCT 29.3* 30.1*  PLT 104* 100*   BMET:  Recent Labs  11/20/15 0330  11/20/15 1545 11/21/15 0405  NA 137  --  137 134*  K 4.3  --  3.9 3.9  CL 108  --  102 103  CO2 23  --   --  26  GLUCOSE 124*  --  130* 146*  BUN 7  --  8 8  CREATININE 0.79  < > 0.80 0.79  CALCIUM 7.5*  --   --  8.2*  < > = values in this interval not displayed.  PT/INR:  Recent Labs  11/19/15 1904  LABPROT 17.9*  INR 1.47   ABG    Component Value Date/Time   PHART 7.410 11/20/2015 0725   HCO3 24.3 (H) 11/20/2015 0725   TCO2 22 11/20/2015 1545   ACIDBASEDEF 2.0 11/19/2015 1939   O2SAT 94.0 11/20/2015 0725   CBG (last 3)   Recent Labs  11/21/15 0104  11/21/15 0406 11/21/15 0911  GLUCAP 117* 134* 120*    Assessment/Plan: S/P Procedure(s) (LRB): CORONARY ARTERY BYPASS GRAFTING (CABG), ON PUMP, TIMES THREE, USING BILATERAL INTERNAL MAMMARY ARTERIES, RIGHT GREATER SAPHENOUS VEIN HARVESTED ENDOSCOPICALLY (N/A) TRANSESOPHAGEAL ECHOCARDIOGRAM (TEE) (N/A)  He is hemodynamically stable in sinus rhythm on IV amio.  Postop atrial fibrillation converted with amio last pm. Will switch to po.  Volume excess: weight still 15 lbs over preop. Start diuresis. Will keep foley in until diuresis completed today.  Continue IS, ambulation   LOS: 4 days    Gaye Pollack 11/21/2015

## 2015-11-21 NOTE — Progress Notes (Signed)
Patient ID: Clinton Lyons, male   DOB: 1956-03-16, 60 y.o.   MRN: [id number]  SICU Evening Rounds:  Hemodynamically stable in sinus rhythm.  Diuresed well today.  Ambulated.

## 2015-11-21 NOTE — Plan of Care (Addendum)
Problem: Activity: Goal: Risk for activity intolerance will decrease Outcome: Progressing Patient walked 300 feet first walk after surgery.  Problem: Cardiac: Goal: Hemodynamic stability will improve Outcome: Progressing Neo weaned off and BP stable through out night.  Problem: Nutritional: Goal: Risk for body nutrition deficit will decrease Outcome: Not Progressing Patient with no appetite and large emesis this morning when walking.  Problem: Respiratory: Goal: Levels of oxygenation will improve Outcome: Progressing Patient weaned to 3 L [place]  Problem: Pain Management: Goal: Pain level will decrease Outcome: Progressing Patient has minimal complaints of pain.

## 2015-11-22 LAB — BASIC METABOLIC PANEL
Anion gap: 5 (ref 5–15)
BUN: 10 mg/dL (ref 6–20)
CHLORIDE: 105 mmol/L (ref 101–111)
CO2: 27 mmol/L (ref 22–32)
CREATININE: 0.83 mg/dL (ref 0.61–1.24)
Calcium: 8.2 mg/dL — ABNORMAL LOW (ref 8.9–10.3)
GFR calc Af Amer: >60 mL/min (ref >60)
GLUCOSE: 101 mg/dL — AB (ref 65–99)
Potassium: 3.9 mmol/L (ref 3.5–5.1)
SODIUM: 137 mmol/L (ref 135–145)

## 2015-11-22 LAB — GLUCOSE, CAPILLARY: GLUCOSE-CAPILLARY: 93 mg/dL (ref 65–99)

## 2015-11-22 MED ORDER — ASPIRIN EC 325 MG PO TBEC
325.0000 mg | DELAYED_RELEASE_TABLET | Freq: Every day | ORAL | Status: DC
  Administered 2015-11-23 – 2015-11-24 (×2): 325 mg via ORAL
  Filled 2015-11-22 (×2): qty 1

## 2015-11-22 MED ORDER — SODIUM CHLORIDE 0.9% FLUSH: 3.0000 mL | INTRAVENOUS | Status: DC | PRN

## 2015-11-22 MED ORDER — DOCUSATE SODIUM 100 MG PO CAPS
200.0000 mg | ORAL_CAPSULE | Freq: Every day | ORAL | Status: DC
  Administered 2015-11-23: 200 mg via ORAL
  Filled 2015-11-22 (×2): qty 2

## 2015-11-22 MED ORDER — POTASSIUM CHLORIDE CRYS ER 20 MEQ PO TBCR
20.0000 meq | EXTENDED_RELEASE_TABLET | Freq: Two times a day (BID) | ORAL | Status: DC
  Administered 2015-11-22 – 2015-11-24 (×5): 20 meq via ORAL
  Filled 2015-11-22 (×5): qty 1

## 2015-11-22 MED ORDER — ONDANSETRON HCL 4 MG/2ML IJ SOLN: 4.0000 mg | Freq: Four times a day (QID) | INTRAMUSCULAR | Status: DC | PRN

## 2015-11-22 MED ORDER — FAMOTIDINE 20 MG PO TABS
20.0000 mg | ORAL_TABLET | Freq: Two times a day (BID) | ORAL | Status: DC
Start: 2015-11-22 — End: 2015-11-24
  Administered 2015-11-22 – 2015-11-24 (×4): 20 mg via ORAL
  Filled 2015-11-22 (×4): qty 1

## 2015-11-22 MED ORDER — METOPROLOL TARTRATE 25 MG PO TABS
25.0000 mg | ORAL_TABLET | Freq: Two times a day (BID) | ORAL | Status: DC
  Administered 2015-11-22 (×2): 25 mg via ORAL
  Filled 2015-11-22 (×2): qty 1

## 2015-11-22 MED ORDER — ACETAMINOPHEN 325 MG PO TABS: 650.0000 mg | ORAL_TABLET | Freq: Four times a day (QID) | ORAL | Status: DC | PRN

## 2015-11-22 MED ORDER — ONDANSETRON HCL 4 MG PO TABS: 4.0000 mg | ORAL_TABLET | Freq: Four times a day (QID) | ORAL | Status: DC | PRN

## 2015-11-22 MED ORDER — BISACODYL 10 MG RE SUPP: 10.0000 mg | Freq: Every day | RECTAL | Status: DC | PRN

## 2015-11-22 MED ORDER — TRAMADOL HCL 50 MG PO TABS
50.0000 mg | ORAL_TABLET | ORAL | Status: DC | PRN
  Administered 2015-11-22: 50 mg via ORAL
  Filled 2015-11-22: qty 1

## 2015-11-22 MED ORDER — SODIUM CHLORIDE 0.9% FLUSH
3.0000 mL | Freq: Two times a day (BID) | INTRAVENOUS | Status: DC
  Administered 2015-11-22: 3 mL via INTRAVENOUS
  Administered 2015-11-22: 6 mL via INTRAVENOUS
  Administered 2015-11-23 (×2): 3 mL via INTRAVENOUS

## 2015-11-22 MED ORDER — BISACODYL 5 MG PO TBEC
10.0000 mg | DELAYED_RELEASE_TABLET | Freq: Every day | ORAL | Status: DC | PRN
  Administered 2015-11-22 – 2015-11-23 (×2): 10 mg via ORAL
  Filled 2015-11-22 (×2): qty 2

## 2015-11-22 MED ORDER — MOVING RIGHT ALONG BOOK
Freq: Once | Status: AC
  Administered 2015-11-22: 15:00:00
  Filled 2015-11-22: qty 1

## 2015-11-22 MED ORDER — FUROSEMIDE 40 MG PO TABS
40.0000 mg | ORAL_TABLET | Freq: Every day | ORAL | Status: DC
  Administered 2015-11-22 – 2015-11-24 (×3): 40 mg via ORAL
  Filled 2015-11-22 (×3): qty 1

## 2015-11-22 MED ORDER — SODIUM CHLORIDE 0.9 % IV SOLN
250.0000 mL | INTRAVENOUS | Status: DC | PRN
Start: 2015-11-22 — End: 2015-11-24

## 2015-11-22 NOTE — Progress Notes (Signed)
3 Days Post-Op Procedure(s) (LRB): CORONARY ARTERY BYPASS GRAFTING (CABG), ON PUMP, TIMES THREE, USING BILATERAL INTERNAL MAMMARY ARTERIES, RIGHT GREATER SAPHENOUS VEIN HARVESTED ENDOSCOPICALLY (N/A) TRANSESOPHAGEAL ECHOCARDIOGRAM (TEE) (N/A) Subjective: No complaints  Objective: Vital signs in last 24 hours: Temp:  [97.6 F (36.4 C)-99.9 F (37.7 C)] 99.3 F (37.4 C) (08/27 0320) Pulse Rate:  [92-110] 101 (08/27 0500) Cardiac Rhythm: Sinus tachycardia (08/27 0800) Resp:  [18-30] 25 (08/27 0700) BP: (103-128)/(68-85) 110/77 (08/27 0600) SpO2:  [93 %-100 %] 99 % (08/27 0600) Weight:  [98.6 kg (217 lb 6.4 oz)] 98.6 kg (217 lb 6.4 oz) (08/27 0600)  Hemodynamic parameters for last 24 hours:    Intake/Output from previous day: 08/26 0701 - 08/27 0700 In: 1488.9 [P.O.:960; I.V.:378.9; IV Piggyback:150] Out: 5600 [Urine:5600] Intake/Output this shift: Total I/O In: 10 [I.V.:10] Out: -   General appearance: alert and cooperative Neurologic: intact Heart: regular rate and rhythm, S1, S2 normal, no murmur, click, rub or gallop Lungs: diminished breath sounds bibasilar Extremities: extremities normal, atraumatic, no cyanosis or edema Wound: dressing dry  Lab Results:  Recent Labs  11/20/15 1930 11/21/15 0405  WBC 11.6* 12.7*  HGB 9.9* 9.9*  HCT 29.3* 30.1*  PLT 104* 100*   BMET:  Recent Labs  11/21/15 0405 11/22/15 0320  NA 134* 137  K 3.9 3.9  CL 103 105  CO2 26 27  GLUCOSE 146* 101*  BUN 8 10  CREATININE 0.79 0.83  CALCIUM 8.2* 8.2*    PT/INR:  Recent Labs  11/19/15 1904  LABPROT 17.9*  INR 1.47   ABG    Component Value Date/Time   PHART 7.410 11/20/2015 0725   HCO3 24.3 (H) 11/20/2015 0725   TCO2 22 11/20/2015 1545   ACIDBASEDEF 2.0 11/19/2015 1939   O2SAT 94.0 11/20/2015 0725   CBG (last 3)   Recent Labs  11/21/15 1917 11/21/15 2318 11/22/15 0318  GLUCAP 105* 97 93    Assessment/Plan: S/P Procedure(s) (LRB): CORONARY ARTERY BYPASS  GRAFTING (CABG), ON PUMP, TIMES THREE, USING BILATERAL INTERNAL MAMMARY ARTERIES, RIGHT GREATER SAPHENOUS VEIN HARVESTED ENDOSCOPICALLY (N/A) TRANSESOPHAGEAL ECHOCARDIOGRAM (TEE) (N/A)  He is hemodynamically stable in sinus rhythm. He had a few brief episodes of atrial fib yesterday with rates in low 100's resolved spontaneously or with a small dose of IV Lopressor. Will continue amio and increase Lopressor to 25 mg bid.  Volume excess: negative 4L and weight down 8 lbs. He is still 12 lbs over preop but does not look edematous. Will start daily lasix and follow weight.  Glucose under good control  Transfer to 2W and continue ambulation, IS.   LOS: 5 days    Gaye Pollack 11/22/2015

## 2015-11-22 NOTE — Progress Notes (Addendum)
CCMD called about a sudden, quick SVT and heart rate 141. Patient heart rate went to 114, now 121.

## 2015-11-23 MED ORDER — NON FORMULARY: 9.0000 mg | Freq: Every evening | Status: DC | PRN

## 2015-11-23 MED ORDER — LISINOPRIL 2.5 MG PO TABS
2.5000 mg | ORAL_TABLET | Freq: Every day | ORAL | Status: DC
  Administered 2015-11-23: 2.5 mg via ORAL
  Filled 2015-11-23: qty 1

## 2015-11-23 MED ORDER — METOPROLOL TARTRATE 50 MG PO TABS
50.0000 mg | ORAL_TABLET | Freq: Two times a day (BID) | ORAL | Status: DC
Start: 2015-11-23 — End: 2015-11-24
  Administered 2015-11-23 (×2): 50 mg via ORAL
  Filled 2015-11-23 (×2): qty 1

## 2015-11-23 MED ORDER — OXYCODONE HCL 5 MG PO TABS: 5.0000 mg | ORAL_TABLET | Freq: Four times a day (QID) | ORAL | Status: DC | PRN

## 2015-11-23 MED ORDER — GUAIFENESIN ER 600 MG PO TB12
600.0000 mg | ORAL_TABLET | Freq: Two times a day (BID) | ORAL | Status: DC | PRN
  Administered 2015-11-23: 600 mg via ORAL
  Filled 2015-11-23: qty 1

## 2015-11-23 MED ORDER — MELATONIN 3 MG PO TABS
9.0000 mg | ORAL_TABLET | Freq: Every evening | ORAL | Status: DC | PRN
  Administered 2015-11-23: 9 mg via ORAL
  Filled 2015-11-23 (×2): qty 3

## 2015-11-23 NOTE — Progress Notes (Signed)
Patient c/o not being able to sleep because he had "discomfort" not pain and phlegm that he couldn't cough up. Patient asked for by name Mucinex and Melatonin. Called the provider on call per patients request and obtain an order for Melatonin, Mucinex, and oxycodone. Dr. Cyndia Bent asked me to remind the patient that when he took Oxycodone earlier it made him nauseated. I asked if he could have Norco instead and Dr. Cyndia Bent said it was basically the same thing as Oxycodone and that he could Oxycodone. Orders entered. Will administer medications.

## 2015-11-23 NOTE — Progress Notes (Signed)
CARDIAC REHAB PHASE I   Pt ambulating independently, no complaints. Cardiac surgery discharge education completed with pt and wife at bedside. Reviewed IS, sternal precautions, activity progression, exercise, heart healthy diet, and phase 2 cardiac rehab. Pt and wife verbalized understanding, receptive to education. Pt agrees to phase 2 cardiac rehab, will send referral to [facility] per pt request. Pt in bed (on bedrest post EPW removal), call bell within reach. Will sign off.   [id number] Lenna Sciara, RN, BSN 11/23/2015 11:30 AM

## 2015-11-23 NOTE — Progress Notes (Addendum)
[address]       [place] [address]             [phone]      4 Days Post-Op Procedure(s) (LRB): CORONARY ARTERY BYPASS GRAFTING (CABG), ON PUMP, TIMES THREE, USING BILATERAL INTERNAL MAMMARY ARTERIES, RIGHT GREATER SAPHENOUS VEIN HARVESTED ENDOSCOPICALLY (N/A) TRANSESOPHAGEAL ECHOCARDIOGRAM (TEE) (N/A) Subjective: Conts to feel well, minimal pain, no SOB, no nausea  Objective: Vital signs in last 24 hours: Temp:  [98.2 F (36.8 C)-98.8 F (37.1 C)] 98.2 F (36.8 C) (08/28 0522) Pulse Rate:  [68-129] 107 (08/28 0522) Cardiac Rhythm: Sinus tachycardia;Other (Comment) (08/27 2354) Resp:  [20-35] 20 (08/28 0522) BP: (104-139)/(73-87) 133/87 (08/28 0522) SpO2:  [84 %-98 %] 95 % (08/28 0522) Weight:  [216 lb 6.4 oz (98.2 kg)] 216 lb 6.4 oz (98.2 kg) (08/28 0522)  Hemodynamic parameters for last 24 hours:    Intake/Output from previous day: 08/27 0701 - 08/28 0700 In: 1352 [P.O.:1320; I.V.:32] Out: 1200 [Urine:1200] Intake/Output this shift: No intake/output data recorded.  General appearance: alert, cooperative and no distress Heart: regular rate and rhythm and tachy Lungs: min dim in bases Abdomen: benign Extremities: + LE edema Wound: incis healing well  Lab Results:  Recent Labs  11/20/15 1930 11/21/15 0405  WBC 11.6* 12.7*  HGB 9.9* 9.9*  HCT 29.3* 30.1*  PLT 104* 100*   BMET:  Recent Labs  11/21/15 0405 11/22/15 0320  NA 134* 137  K 3.9 3.9  CL 103 105  CO2 26 27  GLUCOSE 146* 101*  BUN 8 10  CREATININE 0.79 0.83  CALCIUM 8.2* 8.2*    PT/INR: No results for input(s): LABPROT, INR in the last 72 hours. ABG    Component Value Date/Time   PHART 7.410 11/20/2015 0725   HCO3 24.3 (H) 11/20/2015 0725   TCO2 22 11/20/2015 1545   ACIDBASEDEF 2.0 11/19/2015 1939   O2SAT 94.0 11/20/2015 0725   CBG (last 3)   Recent Labs  11/21/15 1917 11/21/15 2318 11/22/15 0318  GLUCAP 105* 97 93    Meds Scheduled Meds: .  amiodarone  400 mg Oral BID  . aspirin EC  325 mg Oral Daily  . atorvastatin  40 mg Oral q1800  . docusate sodium  200 mg Oral Daily  . famotidine  20 mg Oral BID  . furosemide  40 mg Oral Daily  . latanoprost  1 drop Right Eye QHS  . metoprolol tartrate  25 mg Oral BID  . potassium chloride  20 mEq Oral BID  . sodium chloride flush  3 mL Intravenous Q12H   Continuous Infusions:  PRN Meds:.sodium chloride, acetaminophen, bisacodyl **OR** bisacodyl, guaiFENesin, Melatonin, ondansetron **OR** ondansetron (ZOFRAN) IV, oxyCODONE, sodium chloride flush, traMADol  Xrays No results found.  Assessment/Plan: S/P Procedure(s) (LRB): CORONARY ARTERY BYPASS GRAFTING (CABG), ON PUMP, TIMES THREE, USING BILATERAL INTERNAL MAMMARY ARTERIES, RIGHT GREATER SAPHENOUS VEIN HARVESTED ENDOSCOPICALLY (N/A) TRANSESOPHAGEAL ECHOCARDIOGRAM (TEE) (N/A)  1 doing well overall 2 will increase beta blocker dose for persist tachy 3 no new labs 4 conts gentle diuresis for volume overload 5 should be able to tol low dose ACE-I   LOS: 6 days    GOLD,WAYNE E 11/23/2015  I have seen and examined the patient and agree with the assessment and plan as outlined.  Maintaining NSR.  Increase lopressor and start low dose ACE-I.  Tentatively plan d/c home tomorrow if rhythm stable  Rexene Alberts, MD 11/23/2015 8:20 AM

## 2015-11-23 NOTE — Progress Notes (Signed)
Epicardial pacing wires have been removed per protocol. Wires were intact upon removal. Vitals are stable. Gauze dressings were placed over sites. Pt is resting in bed with call light within reach. BP is being monitored. Bedrest until 11:40 am. Pt has no needs at this time. Will continue to monitor.   Grant Fontana BSN, RN

## 2015-11-24 MED ORDER — AMIODARONE HCL 200 MG PO TABS: 200.0000 mg | ORAL_TABLET | Freq: Every day | ORAL | 0 refills | Status: DC

## 2015-11-24 MED ORDER — ATORVASTATIN CALCIUM 40 MG PO TABS: 40.0000 mg | ORAL_TABLET | Freq: Every day | ORAL | 1 refills | Status: AC

## 2015-11-24 MED ORDER — LISINOPRIL 10 MG PO TABS: 10.0000 mg | ORAL_TABLET | Freq: Every day | ORAL | 1 refills | Status: DC

## 2015-11-24 MED ORDER — METOPROLOL TARTRATE 50 MG PO TABS
75.0000 mg | ORAL_TABLET | Freq: Two times a day (BID) | ORAL | Status: DC
  Administered 2015-11-24: 75 mg via ORAL
  Filled 2015-11-24: qty 1

## 2015-11-24 MED ORDER — ACETAMINOPHEN 325 MG PO TABS: 650.0000 mg | ORAL_TABLET | Freq: Four times a day (QID) | ORAL | Status: DC | PRN

## 2015-11-24 MED ORDER — AMIODARONE HCL 200 MG PO TABS
200.0000 mg | ORAL_TABLET | Freq: Every day | ORAL | Status: DC
  Administered 2015-11-24: 200 mg via ORAL
  Filled 2015-11-24: qty 1

## 2015-11-24 MED ORDER — LISINOPRIL 10 MG PO TABS
10.0000 mg | ORAL_TABLET | Freq: Every day | ORAL | Status: DC
  Administered 2015-11-24: 10 mg via ORAL
  Filled 2015-11-24: qty 1

## 2015-11-24 MED ORDER — ASPIRIN 325 MG PO TBEC: 325.0000 mg | DELAYED_RELEASE_TABLET | Freq: Every day | ORAL | 0 refills | Status: DC

## 2015-11-24 MED ORDER — TRAMADOL HCL 50 MG PO TABS: 50.0000 mg | ORAL_TABLET | ORAL | 0 refills | Status: DC | PRN

## 2015-11-24 MED ORDER — METOPROLOL TARTRATE 75 MG PO TABS: 75.0000 mg | ORAL_TABLET | Freq: Two times a day (BID) | ORAL | 1 refills | Status: DC

## 2015-11-24 NOTE — Progress Notes (Signed)
Pt has been discharged home with wife. Telemetry box was removed. IVs were removed with no complications. Pt and pt's wife received discharge instructions and all questions were answered. Pt left the unit with all of his belongings. Pt was discharged via wheelchair and was accompanied by a Wilfrid Lund and pt's wife. Pt was in no distress at time of discharge.   Grant Fontana BSN, RN

## 2015-11-24 NOTE — Progress Notes (Addendum)
      [address]       [place] [address]             [phone]        5 Days Post-Op Procedure(s) (LRB): CORONARY ARTERY BYPASS GRAFTING (CABG), ON PUMP, TIMES THREE, USING BILATERAL INTERNAL MAMMARY ARTERIES, RIGHT GREATER SAPHENOUS VEIN HARVESTED ENDOSCOPICALLY (N/A) TRANSESOPHAGEAL ECHOCARDIOGRAM (TEE) (N/A)  Subjective: Patient without complaints. He wants to go home.  Objective: Vital signs in last 24 hours: Temp:  [98.2 F (36.8 C)-99.3 F (37.4 C)] 98.7 F (37.1 C) (08/29 0507) Pulse Rate:  [102-111] 109 (08/29 0507) Cardiac Rhythm: Sinus tachycardia (08/29 0700) Resp:  [18] 18 (08/29 0507) BP: (124-154)/(75-92) 154/87 (08/29 0507) SpO2:  [94 %-98 %] 95 % (08/29 0507) Weight:  [209 lb 9.6 oz (95.1 kg)] 209 lb 9.6 oz (95.1 kg) (08/29 0507)  Pre op weight  93 kg Current Weight  11/24/15 209 lb 9.6 oz (95.1 kg)       Intake/Output from previous day: 08/28 0701 - 08/29 0700 In: 320 [P.O.:320] Out: 3046 [Urine:3045; Stool:1]   Physical Exam:  Cardiovascular: Tachycardic Pulmonary: Mostly clear to ausculatation Abdomen: Soft, non tender, bowel sounds present. Extremities: Trace bilateral lower extremity edema. Wounds: Clean and dry.  No erythema or signs of infection.  Lab Results: CBC:No results for input(s): WBC, HGB, HCT, PLT in the last 72 hours. BMET:  Recent Labs  11/22/15 0320  NA 137  K 3.9  CL 105  CO2 27  GLUCOSE 101*  BUN 10  CREATININE 0.83  CALCIUM 8.2*    PT/INR:  Lab Results  Component Value Date   INR 1.47 11/19/2015   INR 1.13 11/18/2015   INR 1.07 11/15/2015   ABG:  INR: Will add last result for INR, ABG once components are confirmed Will add last 4 CBG results once components are confirmed  Assessment/Plan:  1. CV - S/p NSTEMI. ST in the low 100's. On Amiodarone 200 mg daily, Lopressor 75 mg bid (increased this am), and Lisinopril 10 mg daily. 2.  Pulmonary - On room air. Encourage incentive  spirometer. 3. Volume Overload - On Lasix 40 mg daily. Per Dr. Roxy Manns, will not need at discharge. 4.  Acute blood loss anemia - Last H and H 9.9 and 30.1 5. Chest tube sutures to remain and will remove after discharge. 6. Discharge  ZIMMERMAN,DONIELLE MPA-C 11/24/2015,8:08 AM  I have seen and examined the patient and agree with the assessment and plan as outlined.  D/C home today.  Instructions given.  Rexene Alberts, MD 11/24/2015 8:46 AM

## 2015-12-03 ENCOUNTER — Encounter (INDEPENDENT_AMBULATORY_CARE_PROVIDER_SITE_OTHER): Payer: Self-pay

## 2015-12-03 DIAGNOSIS — Z4802 Encounter for removal of sutures: Secondary | ICD-10-CM

## 2015-12-03 DIAGNOSIS — Z951 Presence of aortocoronary bypass graft: Secondary | ICD-10-CM

## 2015-12-25 ENCOUNTER — Other Ambulatory Visit: Payer: Self-pay | Admitting: Thoracic Surgery (Cardiothoracic Vascular Surgery)

## 2015-12-25 DIAGNOSIS — Z951 Presence of aortocoronary bypass graft: Secondary | ICD-10-CM

## 2015-12-28 ENCOUNTER — Ambulatory Visit: Payer: Self-pay | Admitting: Thoracic Surgery (Cardiothoracic Vascular Surgery)

## 2015-12-28 ENCOUNTER — Ambulatory Visit
Admission: RE | Admit: 2015-12-28 | Discharge: 2015-12-28 | Disposition: A | Discharge status: Home / Self Care | Payer: BLUE CROSS/BLUE SHIELD | Source: Ambulatory Visit | Attending: Thoracic Surgery (Cardiothoracic Vascular Surgery) | Admitting: Thoracic Surgery (Cardiothoracic Vascular Surgery)

## 2015-12-28 ENCOUNTER — Ambulatory Visit (INDEPENDENT_AMBULATORY_CARE_PROVIDER_SITE_OTHER): Payer: Self-pay | Admitting: Physician Assistant

## 2015-12-28 VITALS — BP 102/70 | HR 63 | Resp 20 | Ht 70.0 in | Wt 209.0 lb

## 2015-12-28 DIAGNOSIS — Z951 Presence of aortocoronary bypass graft: Secondary | ICD-10-CM

## 2015-12-28 NOTE — Progress Notes (Signed)
Clinton Lyons is a 60 y.o. male patient.  1. S/P CABG x 3    Past Medical History:  Diagnosis Date  . CAD (coronary artery disease) 11/17/2015  . Essential hypertension 11/15/2015  . Glaucoma   . NSTEMI (non-ST elevated myocardial infarction) ([place]) 11/16/2015  . 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   Allergies  Allergen Reactions  . No Known Allergies    Active Problems:   * No active hospital problems. *  Blood pressure 102/70, pulse 63, resp. rate 20, height 5\' 10"  (1.778 m), weight 209 lb (94.8 kg), SpO2 98 %.  Subjective  Feels good today. Reports no pain.   Objective   Gen: Alert and oriented, no distress Cor: RRR, no murmur Pulm: CTA bilaterally Abd: Soft, non-tender, + bowel sounds Extremities: hands cold, good perfusion, no edema Incision: EVH site is c/d/i without drainage. Sternal incision c/d/i without drainage  CLINICAL DATA:  CABG.  EXAM: CHEST  2 VIEW  COMPARISON:  11/21/2015.  FINDINGS: Mediastinum and hilar structures normal. Prior CABG. Heart size stable. Low lung volumes with mild bibasilar atelectasis, improved from prior exam. No pleural effusion or pneumothorax.  IMPRESSION: 1. Low lung volumes with bibasilar atelectasis, improved from prior exam. Follow-up chest x-rays recommended demonstrate clearing.  2. Prior CABG.  Heart size normal.   Electronically Signed   By: Marcello Moores  Register   On: 12/28/2015 13:10  Assessment & Plan  Clinton Lyons reports today for his postop visit. He underwent a CABG 3 on 11/19/2015 with Dr. Roxy Manns. Overall he is feeling very well since surgery and reports no pain. He did complain today of some cold fingers and toes, but has good perfusion. We went over each medication that he is taking in detail. He was worried that he was on too high a dose of Lipitor and this was reviewed with both Dr. Roxy Manns and  the patient. If the patient has any side effects from this medication he is encouraged to get labs drawn by his primary care provider. I explained that sometimes Lipitor can cause elevated liver function tests and he is encouraged have his liver function tests monitored by his cardiologist or primary care physician. We also discontinued his amiodarone today. The patient has been in normal sinus rhythm for several weeks. The patient does become  symptomatic when in atrial fibrillation and reports not having symptoms for several weeks. The patient provided a log of the last several weeks worth of blood pressures and heart rates. He also showed a log of his weight which has been trending down in the last several weeks. He is anxious to return to activity, therefore I cleared him today for light household work. He is also able to putt but cannot participate in a full golf swing at this time. He is also able to drive. I recommended the first few times with supervision. He is not able to ride his motorcycle at this time. I recommended at least a few more weeks for this activity due to the vibration. The patient asked about alcohol intake. I encouraged him to read all the labels on this medication. I did however say that the glass of wine or a can of beer is acceptable. He is to use common sense and drink with moderation. I educated him that alcohol is a blood thinner. Alcohol is not to be taken with  any other blood thinner and it is not to be taken with his pain medication. He has not taking pain medication in several weeks. His blood pressures have been well-controlled over the last several weeks. I did not make any medication adjustments at this time. He is to follow up with his cardiologist next month. He has a cardiac rehabilitation meeting on Thursday of this week. He also has a primary care appointment in January. He is to follow up in our office in 2 months with Dr. Roxy Manns. He had no other questions at this time. All  questions were answered to the patient's satisfaction. He is encouraged to call our office if he has any questions or concerns before his 2 month follow-up. Clinton Lyons 12/28/2015

## 2015-12-31 ENCOUNTER — Encounter: Payer: BLUE CROSS/BLUE SHIELD | Attending: Cardiology | Admitting: *Deleted

## 2015-12-31 VITALS — Ht 69.5 in | Wt 201.4 lb

## 2015-12-31 DIAGNOSIS — I214 Non-ST elevation (NSTEMI) myocardial infarction: Secondary | ICD-10-CM | POA: Insufficient documentation

## 2015-12-31 DIAGNOSIS — Z951 Presence of aortocoronary bypass graft: Secondary | ICD-10-CM | POA: Diagnosis not present

## 2015-12-31 NOTE — Progress Notes (Signed)
Cardiac Individual Treatment Plan  Patient Details  Name: Clinton Lyons MRN: [id number] Date of Birth: 01/16/1956 Referring Provider:   Flowsheet Row Cardiac Rehab from 12/31/2015 in [facility] Cardiac and Pulmonary Rehab  Referring Provider  Isaias Cowman MD      Initial Encounter Date:  Flowsheet Row Cardiac Rehab from 12/31/2015 in [facility] Cardiac and Pulmonary Rehab  Date  12/31/15  Referring Provider  Isaias Cowman MD      Visit Diagnosis: NSTEMI (non-ST elevated myocardial infarction) ([place])  S/P CABG x 3  Patient's Home Medications on Admission:  Current Outpatient Prescriptions:  .  aspirin EC 325 MG EC tablet, Take 1 tablet (325 mg total) by mouth daily., Disp: 30 tablet, Rfl: 0 .  atorvastatin (LIPITOR) 40 MG tablet, Take 1 tablet (40 mg total) by mouth daily at 6 PM., Disp: 30 tablet, Rfl: 1 .  Cholecalciferol (VITAMIN D3) 2000 units capsule, Take 4,000 Units by mouth every morning. , Disp: , Rfl:  .  glucosamine-chondroitin 500-400 MG tablet, Take 1 tablet by mouth every morning., Disp: , Rfl:  .  latanoprost (XALATAN) 0.005 % ophthalmic solution, Place 1 drop into the right eye at bedtime., Disp: , Rfl:  .  Multiple Vitamins-Minerals (PX ADVANCED FORMULA MULTIVITS) TABS, Take 1 tablet by mouth every morning., Disp: , Rfl:  .  acetaminophen (TYLENOL) 325 MG tablet, Take 2 tablets (650 mg total) by mouth every 6 (six) hours as needed for mild pain., Disp: , Rfl:  .  amiodarone (PACERONE) 200 MG tablet, Take 1 tablet (200 mg total) by mouth daily. (Patient not taking: Reported on 12/31/2015), Disp: 30 tablet, Rfl: 0 .  lisinopril (PRINIVIL,ZESTRIL) 5 MG tablet, , Disp: , Rfl:  .  metoprolol tartrate (LOPRESSOR) 25 MG tablet, , Disp: , Rfl:  .  traMADol (ULTRAM) 50 MG tablet, Take 1 tablet (50 mg total) by mouth every 4 (four) hours as needed for moderate pain., Disp: 28 tablet, Rfl: 0  Past Medical History: Past Medical History:  Diagnosis Date  . CAD (coronary  artery disease) 11/17/2015  . Essential hypertension 11/15/2015  . Glaucoma   . NSTEMI (non-ST elevated myocardial infarction) ([place]) 11/16/2015  . 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 Never Smoker  Smokeless Tobacco  . Never Used    Labs: Recent Review Flowsheet Data    Labs for ITP Cardiac and Pulmonary Rehab Latest Ref Rng & Units 11/19/2015 11/19/2015 11/20/2015 11/20/2015 11/20/2015   Cholestrol 0 - 200 mg/dL - - - - -   LDLCALC 0 - 99 mg/dL - - - - -   HDL >40 mg/dL - - - - -   Trlycerides <150 mg/dL - - - - -   Hemoglobin A1c 4.8 - 5.6 % - - - - -   PHART 7.350 - 7.450 7.424 7.317(L) 7.395 7.410 -   PCO2ART 35.0 - 45.0 mmHg 34.7(L) 47.0(H) 40.4 38.7 -   HCO3 20.0 - 24.0 mEq/L 22.7 24.2(H) 24.6(H) 24.3(H) -   TCO2 0 - 100 mmol/L 24 26 26 25 22    ACIDBASEDEF 0.0 - 2.0 mmol/L 1.0 2.0 - - -   O2SAT % 98.0 97.0 94.0 94.0 -       Exercise Target Goals: Date: 12/31/15  Exercise Program Goal: Individual exercise prescription set with THRR, safety & activity barriers. Participant demonstrates ability to understand and report RPE using BORG  scale, to self-measure pulse accurately, and to acknowledge the importance of the exercise prescription.  Exercise Prescription Goal: Starting with aerobic activity 30 plus minutes a day, 3 days per week for initial exercise prescription. Provide home exercise prescription and guidelines that participant acknowledges understanding prior to discharge.  Activity Barriers & Risk Stratification:     Activity Barriers & Cardiac Risk Stratification - 12/31/15 1502      Activity Barriers & Cardiac Risk Stratification   Activity Barriers None   Cardiac Risk Stratification High      6 Minute Walk:     6 Minute Walk    Row Name 12/31/15 1504         6 Minute Walk   Phase Initial     Distance 1660 feet     Walk Time 6 minutes     # of Rest Breaks 0      MPH 3.14     METS 3.89     RPE 7     VO2 Peak 13.62     Symptoms No     Resting HR 61 bpm     Resting BP 102/60     Max Ex. HR 104 bpm     Max Ex. BP 126/60     2 Minute Post BP 114/60        Initial Exercise Prescription:     Initial Exercise Prescription - 12/31/15 1500      Date of Initial Exercise RX and Referring Provider   Date 12/31/15   Referring Provider Isaias Cowman MD     Treadmill   MPH 3   Grade 1.5   Minutes 15   METs 3.92     Elliptical   Level 2   Speed 3.8   Minutes 15   METs 6.5     T5 Nustep   Level 3   Watts --  80-100 spm   Minutes 15   METs 3     Prescription Details   Frequency (times per week) 3   Duration Progress to 45 minutes of aerobic exercise without signs/symptoms of physical distress     Intensity   THRR 40-80% of Max Heartrate 99-139   Ratings of Perceived Exertion 11-15   Perceived Dyspnea 0-4     Progression   Progression Continue to progress workloads to maintain intensity without signs/symptoms of physical distress.     Resistance Training   Training Prescription Yes   Weight 4 lbs   Reps 10-12      Perform Capillary Blood Glucose checks as needed.  Exercise Prescription Changes:     Exercise Prescription Changes    Row Name 12/31/15 1500             Exercise Review   Progression -  walk test results         Response to Exercise   Blood Pressure (Admit) 102/60       Blood Pressure (Exercise) 126/60       Blood Pressure (Exit) 114/60       Heart Rate (Admit) 61 bpm       Heart Rate (Exercise) 104 bpm       Oxygen Saturation (Admit) 100 %       Oxygen Saturation (Exercise) 96 %       Rating of Perceived Exertion (Exercise) 7       Symptoms none          Exercise Comments:   Discharge Exercise Prescription (Final Exercise Prescription Changes):  Exercise Prescription Changes - 12/31/15 1500      Exercise Review   Progression --  walk test results     Response to  Exercise   Blood Pressure (Admit) 102/60   Blood Pressure (Exercise) 126/60   Blood Pressure (Exit) 114/60   Heart Rate (Admit) 61 bpm   Heart Rate (Exercise) 104 bpm   Oxygen Saturation (Admit) 100 %   Oxygen Saturation (Exercise) 96 %   Rating of Perceived Exertion (Exercise) 7   Symptoms none      Nutrition:  Target Goals: Understanding of nutrition guidelines, daily intake of sodium 1500mg , cholesterol 200mg , calories 30% from fat and 7% or less from saturated fats, daily to have 5 or more servings of fruits and vegetables.  Biometrics:     Pre Biometrics - 12/31/15 1510      Pre Biometrics   Height 5' 9.5" (1.765 m)   Weight 201 lb 6.4 oz (91.4 kg)   Waist Circumference 38 inches   Hip Circumference 39 inches   Waist to Hip Ratio 0.97 %   BMI (Calculated) 29.4   Single Leg Stand 30 seconds       Nutrition Therapy Plan and Nutrition Goals:     Nutrition Therapy & Goals - 12/31/15 1506      Nutrition Therapy   Diet --  Al majored in Engineer, maintenance at [employer].    Drug/Food Interactions Statins/Certain Fruits     Intervention Plan   Intervention Prescribe, educate and counsel regarding individualized specific dietary modifications aiming towards targeted core components such as weight, hypertension, lipid management, diabetes, heart failure and other comorbidities.   Expected Outcomes Short Term Goal: Understand basic principles of dietary content, such as calories, fat, sodium, cholesterol and nutrients.      Nutrition Discharge: Rate Your Plate Scores:   Nutrition Goals Re-Evaluation:   Psychosocial: Target Goals: Acknowledge presence or absence of depression, maximize coping skills, provide positive support system. Participant is able to verbalize types and ability to use techniques and skills needed for reducing stress and depression.  Initial Review & Psychosocial Screening:     Initial Psych Review & Screening - 12/31/15  [place]? Yes   Comments Al recently moved from [place] and was never really sick until he needed open heart surgery. He feels he did very well post op. Al's wife is a breast cancer survivor diagnosed 5 years ago and she now volunters at the [facility]       Quality of Life Scores:     Quality of Life - 12/31/15 1510      Quality of Life Scores   Health/Function Pre 27.5 %   Socioeconomic Pre 28.93 %   Psych/Spiritual Pre 30 %   Family Pre 27.6 %   GLOBAL Pre 28.35 %      PHQ-9: Recent Review Flowsheet Data    Depression screen [facility] 2/9 12/31/2015   Decreased Interest 0   Down, Depressed, Hopeless 0   PHQ - 2 Score 0   Altered sleeping 1   Tired, decreased energy 2   Change in appetite 0   Feeling bad or failure about yourself  0   Trouble concentrating 0   Moving slowly or fidgety/restless 0   Suicidal thoughts 0   PHQ-9 Score 3   Difficult doing work/chores Not difficult at all      Psychosocial Evaluation and Intervention:   Psychosocial Re-Evaluation:  Vocational Rehabilitation: Provide vocational rehab assistance to qualifying candidates.   Vocational Rehab Evaluation & Intervention:     Vocational Rehab - 12/31/15 1504      Initial Vocational Rehab Evaluation & Intervention   Assessment shows need for Vocational Rehabilitation No      Education: Education Goals: Education classes will be provided on a weekly basis, covering required topics. Participant will state understanding/return demonstration of topics presented.  Learning Barriers/Preferences:     Learning Barriers/Preferences - 12/31/15 1503      Learning Barriers/Preferences   Learning Barriers None   Learning Preferences None      Education Topics: General Nutrition Guidelines/Fats and Fiber: -Group instruction provided by verbal, written material, models and posters to present the general guidelines for heart healthy nutrition. Gives  an explanation and review of dietary fats and fiber.   Controlling Sodium/Reading Food Labels: -Group verbal and written material supporting the discussion of sodium use in heart healthy nutrition. Review and explanation with models, verbal and written materials for utilization of the food label.   Exercise Physiology & Risk Factors: - Group verbal and written instruction with models to review the exercise physiology of the cardiovascular system and associated critical values. Details cardiovascular disease risk factors and the goals associated with each risk factor.   Aerobic Exercise & Resistance Training: - Gives group verbal and written discussion on the health impact of inactivity. On the components of aerobic and resistive training programs and the benefits of this training and how to safely progress through these programs.   Flexibility, Balance, General Exercise Guidelines: - Provides group verbal and written instruction on the benefits of flexibility and balance training programs. Provides general exercise guidelines with specific guidelines to those with heart or lung disease. Demonstration and skill practice provided.   Stress Management: - Provides group verbal and written instruction about the health risks of elevated stress, cause of high stress, and healthy ways to reduce stress.   Depression: - Provides group verbal and written instruction on the correlation between heart/lung disease and depressed mood, treatment options, and the stigmas associated with seeking treatment.   Anatomy & Physiology of the Heart: - Group verbal and written instruction and models provide basic cardiac anatomy and physiology, with the coronary electrical and arterial systems. Review of: AMI, Angina, Valve disease, Heart Failure, Cardiac Arrhythmia, Pacemakers, and the ICD.   Cardiac Procedures: - Group verbal and written instruction and models to describe the testing methods done to diagnose  heart disease. Reviews the outcomes of the test results. Describes the treatment choices: Medical Management, Angioplasty, or Coronary Bypass Surgery.   Cardiac Medications: - Group verbal and written instruction to review commonly prescribed medications for heart disease. Reviews the medication, class of the drug, and side effects. Includes the steps to properly store meds and maintain the prescription regimen.   Go Sex-Intimacy & Heart Disease, Get SMART - Goal Setting: - Group verbal and written instruction through game format to discuss heart disease and the return to sexual intimacy. Provides group verbal and written material to discuss and apply goal setting through the application of the S.M.A.R.T. Method.   Other Matters of the Heart: - Provides group verbal, written materials and models to describe Heart Failure, Angina, Valve Disease, and Diabetes in the realm of heart disease. Includes description of the disease process and treatment options available to the cardiac patient.   Exercise & Equipment Safety: - Individual verbal instruction and demonstration of equipment use and safety with use of  the equipment. Flowsheet Row Cardiac Rehab from 12/31/2015 in [facility] Cardiac and Pulmonary Rehab  Date  12/31/15  Educator  C. Lois Slagel, RN  Instruction Review Code  1- partially meets, needs review/practice      Infection Prevention: - Provides verbal and written material to individual with discussion of infection control including proper hand washing and proper equipment cleaning during exercise session. Flowsheet Row Cardiac Rehab from 12/31/2015 in [facility] Cardiac and Pulmonary Rehab  Date  12/31/15  Educator  C. EnterkinRN  Instruction Review Code  2- meets goals/outcomes      Falls Prevention: - Provides verbal and written material to individual with discussion of falls prevention and safety. Flowsheet Row Cardiac Rehab from 12/31/2015 in [facility] Cardiac and Pulmonary Rehab  Date   12/31/15  Educator  C. [place]  Instruction Review Code  2- meets goals/outcomes      Diabetes: - Individual verbal and written instruction to review signs/symptoms of diabetes, desired ranges of glucose level fasting, after meals and with exercise. Advice that pre and post exercise glucose checks will be done for 3 sessions at entry of program.    Knowledge Questionnaire Score:     Knowledge Questionnaire Score - 12/31/15 1503      Knowledge Questionnaire Score   Pre Score 28      Core Components/Risk Factors/Patient Goals at Admission:     Personal Goals and Risk Factors at Admission - 12/31/15 1509      Core Components/Risk Factors/Patient Goals on Admission    Weight Management Yes;Weight Maintenance   Intervention Weight Management: Develop a combined nutrition and exercise program designed to reach desired caloric intake, while maintaining appropriate intake of nutrient and fiber, sodium and fats, and appropriate energy expenditure required for the weight goal.;Weight Management: Provide education and appropriate resources to help participant work on and attain dietary goals.   Expected Outcomes Weight Maintenance: Understanding of the daily nutrition guidelines, which includes 25-35% calories from fat, 7% or less cal from saturated fats, less than 200mg  cholesterol, less than 1.5gm of sodium, & 5 or more servings of fruits and vegetables daily   Increase Strength and Stamina Yes  Able to play golf and ride motorcycle again   Intervention Provide advice, education, support and counseling about physical activity/exercise needs.;Develop an individualized exercise prescription for aerobic and resistive training based on initial evaluation findings, risk stratification, comorbidities and participant's personal goals.   Expected Outcomes Achievement of increased cardiorespiratory fitness and enhanced flexibility, muscular endurance and strength shown through measurements of  functional capacity and personal statement of participant.   Hypertension Yes   Intervention Provide education on lifestyle modifcations including regular physical activity/exercise, weight management, moderate sodium restriction and increased consumption of fresh fruit, vegetables, and low fat dairy, alcohol moderation, and smoking cessation.;Monitor prescription use compliance.   Expected Outcomes Short Term: Continued assessment and intervention until BP is < 140/39mm HG in hypertensive participants. < 130/42mm HG in hypertensive participants with diabetes, heart failure or chronic kidney disease.;Long Term: Maintenance of blood pressure at goal levels.      Core Components/Risk Factors/Patient Goals Review:    Core Components/Risk Factors/Patient Goals at Discharge (Final Review):    ITP Comments:     ITP Comments    Row Name 12/31/15 1505 12/31/15 1506 12/31/15 1510       ITP Comments "Al" is ready to start in Cardiac Rehab. Appt made for him and his wife with Cardiac Rehab REgistered [place].  Al majored in Engineer, maintenance at [employer]. He scored 28/28 on his Pre Cardiac REhb questionaire Al recently moved from [place] and was never really sick until he needed open heart surgery. He feels he did very well post op. Al's wife is a breast cancer survivor diagnosed 5 years ago and she now volunters at the [facility].         Comments: Ready to start Cardiac Rehab after his open heart surgery.

## 2015-12-31 NOTE — Progress Notes (Signed)
Daily Session Note  Patient Details  Name: Atom Solivan MRN: [id number] Date of Birth: 1955/09/11 Referring Provider:   Flowsheet Row Cardiac Rehab from 12/31/2015 in [facility] Cardiac and Pulmonary Rehab  Referring Provider  Isaias Cowman MD      Encounter Date: 12/31/2015  Check In:     Session Check In - 12/31/15 1504      Check-In   Location [facility]-Cardiac & Pulmonary Rehab   Staff Present Gerlene Burdock, RN, Levie Heritage, MA, ACSM RCEP, Exercise Physiologist   Supervising physician immediately available to respond to emergencies See telemetry face sheet for immediately available ER MD   Medication changes reported     No   Fall or balance concerns reported    No   Warm-up and Cool-down Not performed (comment)   Resistance Training Performed No   VAD Patient? No     Pain Assessment   Currently in Pain? No/denies           Exercise Prescription Changes - 12/31/15 1500      Exercise Review   Progression --  walk test results     Response to Exercise   Blood Pressure (Admit) 102/60   Blood Pressure (Exercise) 126/60   Blood Pressure (Exit) 114/60   Heart Rate (Admit) 61 bpm   Heart Rate (Exercise) 104 bpm   Oxygen Saturation (Admit) 100 %   Oxygen Saturation (Exercise) 96 %   Rating of Perceived Exertion (Exercise) 7   Symptoms none      Goals Met:  Proper associated with RPD/PD & O2 Sat Personal goals reviewed  Goals Unmet:  Not Applicable  Comments:     Dr. Emily Filbert is Medical Director for [place] and [facility].

## 2015-12-31 NOTE — Patient Instructions (Signed)
Patient Instructions  Patient Details  Name: Clinton Lyons MRN: [id number] Date of Birth: Feb 27, 1956 Referring Provider:  Isaias Cowman, MD  Below are the personal goals you chose as well as exercise and nutrition goals. Our goal is to help you keep on track towards obtaining and maintaining your goals. We will be discussing your progress on these goals with you throughout the program.  Initial Exercise Prescription:     Initial Exercise Prescription - 12/31/15 1500      Date of Initial Exercise RX and Referring Provider   Date 12/31/15   Referring Provider Paraschos, Alexander MD     Treadmill   MPH 3   Grade 1.5   Minutes 15   METs 3.92     Elliptical   Level 2   Speed 3.8   Minutes 15   METs 6.5     T5 Nustep   Level 3   Watts --  80-100 spm   Minutes 15   METs 3     Prescription Details   Frequency (times per week) 3   Duration Progress to 45 minutes of aerobic exercise without signs/symptoms of physical distress     Intensity   THRR 40-80% of Max Heartrate 99-139   Ratings of Perceived Exertion 11-15   Perceived Dyspnea 0-4     Progression   Progression Continue to progress workloads to maintain intensity without signs/symptoms of physical distress.     Resistance Training   Training Prescription Yes   Weight 4 lbs   Reps 10-12      Exercise Goals: Frequency: Be able to perform aerobic exercise three times per week working toward 3-5 days per week.  Intensity: Work with a perceived exertion of 11 (fairly light) - 15 (hard) as tolerated. Follow your new exercise prescription and watch for changes in prescription as you progress with the program. Changes will be reviewed with you when they are made.  Duration: You should be able to do 30 minutes of continuous aerobic exercise in addition to a 5 minute warm-up and a 5 minute cool-down routine.  Nutrition Goals: Your personal nutrition goals will be established when you do your nutrition  analysis with the dietician.  The following are nutrition guidelines to follow: Cholesterol < 200mg /day Sodium < 1500mg /day Fiber: Men over 50 yrs - 30 grams per day  Personal Goals:     Personal Goals and Risk Factors at Admission - 12/31/15 1509      Core Components/Risk Factors/Patient Goals on Admission    Weight Management Yes;Weight Maintenance   Intervention Weight Management: Develop a combined nutrition and exercise program designed to reach desired caloric intake, while maintaining appropriate intake of nutrient and fiber, sodium and fats, and appropriate energy expenditure required for the weight goal.;Weight Management: Provide education and appropriate resources to help participant work on and attain dietary goals.   Expected Outcomes Weight Maintenance: Understanding of the daily nutrition guidelines, which includes 25-35% calories from fat, 7% or less cal from saturated fats, less than 200mg  cholesterol, less than 1.5gm of sodium, & 5 or more servings of fruits and vegetables daily   Increase Strength and Stamina Yes  Able to play golf and ride motorcycle again   Intervention Provide advice, education, support and counseling about physical activity/exercise needs.;Develop an individualized exercise prescription for aerobic and resistive training based on initial evaluation findings, risk stratification, comorbidities and participant's personal goals.   Expected Outcomes Achievement of increased cardiorespiratory fitness and enhanced flexibility, muscular endurance and strength shown  through measurements of functional capacity and personal statement of participant.   Hypertension Yes   Intervention Provide education on lifestyle modifcations including regular physical activity/exercise, weight management, moderate sodium restriction and increased consumption of fresh fruit, vegetables, and low fat dairy, alcohol moderation, and smoking cessation.;Monitor prescription use  compliance.   Expected Outcomes Short Term: Continued assessment and intervention until BP is < 140/60mm HG in hypertensive participants. < 130/89mm HG in hypertensive participants with diabetes, heart failure or chronic kidney disease.;Long Term: Maintenance of blood pressure at goal levels.      Tobacco Use Initial Evaluation: History  Smoking Status  . Never Smoker  Smokeless Tobacco  . Never Used    Copy of goals given to participant.

## 2016-01-04 ENCOUNTER — Encounter: Payer: BLUE CROSS/BLUE SHIELD | Admitting: *Deleted

## 2016-01-04 DIAGNOSIS — Z951 Presence of aortocoronary bypass graft: Secondary | ICD-10-CM

## 2016-01-04 DIAGNOSIS — I214 Non-ST elevation (NSTEMI) myocardial infarction: Secondary | ICD-10-CM

## 2016-01-04 NOTE — Progress Notes (Signed)
Daily Session Note  Patient Details  Name: Clinton Lyons MRN: [id number] Date of Birth: Aug 05, 1955 Referring Provider:   Flowsheet Row Cardiac Rehab from 12/31/2015 in [facility]  Referring Provider  Clinton Cowman MD      Encounter Date: 01/04/2016  Check In:     Session Check In - 01/04/16 0805      Check-In   Location [facility]   Staff Present Alberteen Sam, MA, ACSM RCEP, Exercise Physiologist;Kelly Amedeo Plenty, BS, ACSM CEP, Exercise Physiologist;Carroll Enterkin, RN, BSN   Supervising physician immediately available to respond to emergencies See telemetry face sheet for immediately available ER MD   Medication changes reported     No   Fall or balance concerns reported    No   Warm-up and Cool-down Performed on first and last piece of equipment   Resistance Training Performed Yes   VAD Patient? No     Pain Assessment   Currently in Pain? No/denies   Multiple Pain Sites No         Goals Met:  Exercise tolerated well Personal goals reviewed No report of cardiac concerns or symptoms Strength training completed today  Goals Unmet:  Not Applicable  Comments: First full day of exercise!  Patient was oriented to gym and equipment including functions, settings, policies, and procedures.  Patient's individual exercise prescription and treatment plan were reviewed.  All starting workloads were established based on the results of the 6 minute walk test done at initial orientation visit.  The plan for exercise progression was also introduced and progression will be customized based on patient's performance and goals.    Dr. Emily Lyons is Medical Director for [name].

## 2016-01-06 ENCOUNTER — Encounter: Payer: Self-pay | Admitting: *Deleted

## 2016-01-06 DIAGNOSIS — I214 Non-ST elevation (NSTEMI) myocardial infarction: Secondary | ICD-10-CM

## 2016-01-06 DIAGNOSIS — Z951 Presence of aortocoronary bypass graft: Secondary | ICD-10-CM

## 2016-01-06 NOTE — Progress Notes (Signed)
Cardiac Individual Treatment Plan  Patient Details  Name: Clinton Lyons MRN: [id number] Date of Birth: August 30, 1955 Referring Provider:   Flowsheet Row Cardiac Rehab from 12/31/2015 in [facility] Cardiac and Pulmonary Rehab  Referring Provider  Isaias Cowman MD      Initial Encounter Date:  Flowsheet Row Cardiac Rehab from 12/31/2015 in [facility] Cardiac and Pulmonary Rehab  Date  12/31/15  Referring Provider  Isaias Cowman MD      Visit Diagnosis: NSTEMI (non-ST elevated myocardial infarction) ([place])  S/P CABG x 3  Patient's Home Medications on Admission:  Current Outpatient Prescriptions:  .  acetaminophen (TYLENOL) 325 MG tablet, Take 2 tablets (650 mg total) by mouth every 6 (six) hours as needed for mild pain., Disp: , Rfl:  .  amiodarone (PACERONE) 200 MG tablet, Take 1 tablet (200 mg total) by mouth daily. (Patient not taking: Reported on 12/31/2015), Disp: 30 tablet, Rfl: 0 .  aspirin EC 325 MG EC tablet, Take 1 tablet (325 mg total) by mouth daily., Disp: 30 tablet, Rfl: 0 .  atorvastatin (LIPITOR) 40 MG tablet, Take 1 tablet (40 mg total) by mouth daily at 6 PM., Disp: 30 tablet, Rfl: 1 .  Cholecalciferol (VITAMIN D3) 2000 units capsule, Take 4,000 Units by mouth every morning. , Disp: , Rfl:  .  glucosamine-chondroitin 500-400 MG tablet, Take 1 tablet by mouth every morning., Disp: , Rfl:  .  latanoprost (XALATAN) 0.005 % ophthalmic solution, Place 1 drop into the right eye at bedtime., Disp: , Rfl:  .  lisinopril (PRINIVIL,ZESTRIL) 5 MG tablet, , Disp: , Rfl:  .  metoprolol tartrate (LOPRESSOR) 25 MG tablet, , Disp: , Rfl:  .  Multiple Vitamins-Minerals (PX ADVANCED FORMULA MULTIVITS) TABS, Take 1 tablet by mouth every morning., Disp: , Rfl:  .  traMADol (ULTRAM) 50 MG tablet, Take 1 tablet (50 mg total) by mouth every 4 (four) hours as needed for moderate pain., Disp: 28 tablet, Rfl: 0  Past Medical History: Past Medical History:  Diagnosis Date  . CAD (coronary  artery disease) 11/17/2015  . Essential hypertension 11/15/2015  . Glaucoma   . NSTEMI (non-ST elevated myocardial infarction) ([place]) 11/16/2015  . 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 Never Smoker  Smokeless Tobacco  . Never Used    Labs: Recent Review Flowsheet Data    Labs for ITP Cardiac and Pulmonary Rehab Latest Ref Rng & Units 11/19/2015 11/19/2015 11/20/2015 11/20/2015 11/20/2015   Cholestrol 0 - 200 mg/dL - - - - -   LDLCALC 0 - 99 mg/dL - - - - -   HDL >40 mg/dL - - - - -   Trlycerides <150 mg/dL - - - - -   Hemoglobin A1c 4.8 - 5.6 % - - - - -   PHART 7.350 - 7.450 7.424 7.317(L) 7.395 7.410 -   PCO2ART 35.0 - 45.0 mmHg 34.7(L) 47.0(H) 40.4 38.7 -   HCO3 20.0 - 24.0 mEq/L 22.7 24.2(H) 24.6(H) 24.3(H) -   TCO2 0 - 100 mmol/L '24 26 26 25 22   ' ACIDBASEDEF 0.0 - 2.0 mmol/L 1.0 2.0 - - -   O2SAT % 98.0 97.0 94.0 94.0 -       Exercise Target Goals:    Exercise Program Goal: Individual exercise prescription set with THRR, safety & activity barriers. Participant demonstrates ability to understand and report RPE using BORG  scale, to self-measure pulse accurately, and to acknowledge the importance of the exercise prescription.  Exercise Prescription Goal: Starting with aerobic activity 30 plus minutes a day, 3 days per week for initial exercise prescription. Provide home exercise prescription and guidelines that participant acknowledges understanding prior to discharge.  Activity Barriers & Risk Stratification:     Activity Barriers & Cardiac Risk Stratification - 12/31/15 1502      Activity Barriers & Cardiac Risk Stratification   Activity Barriers None   Cardiac Risk Stratification High      6 Minute Walk:     6 Minute Walk    Row Name 12/31/15 1504         6 Minute Walk   Phase Initial     Distance 1660 feet     Walk Time 6 minutes     # of Rest Breaks 0     MPH 3.14      METS 3.89     RPE 7     VO2 Peak 13.62     Symptoms No     Resting HR 61 bpm     Resting BP 102/60     Max Ex. HR 104 bpm     Max Ex. BP 126/60     2 Minute Post BP 114/60        Initial Exercise Prescription:     Initial Exercise Prescription - 12/31/15 1500      Date of Initial Exercise RX and Referring Provider   Date 12/31/15   Referring Provider Isaias Cowman MD     Treadmill   MPH 3   Grade 1.5   Minutes 15   METs 3.92     Elliptical   Level 2   Speed 3.8   Minutes 15   METs 6.5     T5 Nustep   Level 3   Watts --  80-100 spm   Minutes 15   METs 3     Prescription Details   Frequency (times per week) 3   Duration Progress to 45 minutes of aerobic exercise without signs/symptoms of physical distress     Intensity   THRR 40-80% of Max Heartrate 99-139   Ratings of Perceived Exertion 11-15   Perceived Dyspnea 0-4     Progression   Progression Continue to progress workloads to maintain intensity without signs/symptoms of physical distress.     Resistance Training   Training Prescription Yes   Weight 4 lbs   Reps 10-12      Perform Capillary Blood Glucose checks as needed.  Exercise Prescription Changes:     Exercise Prescription Changes    Row Name 12/31/15 1500             Exercise Review   Progression -  walk test results         Response to Exercise   Blood Pressure (Admit) 102/60       Blood Pressure (Exercise) 126/60       Blood Pressure (Exit) 114/60       Heart Rate (Admit) 61 bpm       Heart Rate (Exercise) 104 bpm       Oxygen Saturation (Admit) 100 %       Oxygen Saturation (Exercise) 96 %       Rating of Perceived Exertion (Exercise) 7       Symptoms none          Exercise Comments:     Exercise Comments  [place] Name 01/04/16 0807           Exercise Comments First full day of exercise!  Patient was oriented to gym and equipment including functions, settings, policies, and procedures.  Patient's  individual exercise prescription and treatment plan were reviewed.  All starting workloads were established based on the results of the 6 minute walk test done at initial orientation visit.  The plan for exercise progression was also introduced and progression will be customized based on patient's performance and goals.          Discharge Exercise Prescription (Final Exercise Prescription Changes):     Exercise Prescription Changes - 12/31/15 1500      Exercise Review   Progression --  walk test results     Response to Exercise   Blood Pressure (Admit) 102/60   Blood Pressure (Exercise) 126/60   Blood Pressure (Exit) 114/60   Heart Rate (Admit) 61 bpm   Heart Rate (Exercise) 104 bpm   Oxygen Saturation (Admit) 100 %   Oxygen Saturation (Exercise) 96 %   Rating of Perceived Exertion (Exercise) 7   Symptoms none      Nutrition:  Target Goals: Understanding of nutrition guidelines, daily intake of sodium 1500mg , cholesterol 200mg , calories 30% from fat and 7% or less from saturated fats, daily to have 5 or more servings of fruits and vegetables.  Biometrics:     Pre Biometrics - 12/31/15 1510      Pre Biometrics   Height 5' 9.5" (1.765 m)   Weight 201 lb 6.4 oz (91.4 kg)   Waist Circumference 38 inches   Hip Circumference 39 inches   Waist to Hip Ratio 0.97 %   BMI (Calculated) 29.4   Single Leg Stand 30 seconds       Nutrition Therapy Plan and Nutrition Goals:     Nutrition Therapy & Goals - 01/01/16 1238      Nutrition Therapy   Diet Instructed patient and his wife on heart healthy dietary guidelines based on an 1800 calorie meal plan including DASH diet principles.   Drug/Food Interactions Statins/Certain Fruits   Protein (specify units) 8   Fiber 30 grams   Whole Grain Foods 3 servings   Saturated Fats 12 max. grams   Fruits and Vegetables 8 servings/day   Sodium 1500 grams  2000 mg acceptable     Personal Nutrition Goals   Personal Goal #1 Increase  water intake to 3(16oz) bottles daily.   Personal Goal #2 Strive for 8 servings of fruits/vegetables daily   Personal Goal #3 Use myfitness pal or calorieking to look up nutrition information especially when eating "out".   Personal Goal #4 Add more beans to diet.     Intervention Plan   Intervention Prescribe, educate and counsel regarding individualized specific dietary modifications aiming towards targeted core components such as weight, hypertension, lipid management, diabetes, heart failure and other comorbidities.;Nutrition handout(s) given to patient.   Expected Outcomes Short Term Goal: Understand basic principles of dietary content, such as calories, fat, sodium, cholesterol and nutrients.;Short Term Goal: A plan has been developed with personal nutrition goals set during dietitian appointment.;Long Term Goal: Adherence to prescribed nutrition plan.      Nutrition Discharge: Rate Your Plate Scores:     Nutrition Assessments - 01/01/16 1247      Rate Your Plate Scores   Pre Score 73   Pre Score % 83 %      Nutrition Goals Re-Evaluation:   Psychosocial: Target Goals: Acknowledge  presence or absence of depression, maximize coping skills, provide positive support system. Participant is able to verbalize types and ability to use techniques and skills needed for reducing stress and depression.  Initial Review & Psychosocial Screening:     Initial Psych Review & Screening - 12/31/15 [place]? Yes   Comments Al recently moved from [place] and was never really sick until he needed open heart surgery. He feels he did very well post op. Al's wife is a breast cancer survivor diagnosed 5 years ago and she now volunters at the [facility].       Quality of Life Scores:     Quality of Life - 12/31/15 1510      Quality of Life Scores   Health/Function Pre 27.5 %   Socioeconomic Pre 28.93 %   Psych/Spiritual Pre 30 %   Family Pre 27.6 %    GLOBAL Pre 28.35 %      PHQ-9: Recent Review Flowsheet Data    Depression screen [facility] 2/9 12/31/2015   Decreased Interest 0   Down, Depressed, Hopeless 0   PHQ - 2 Score 0   Altered sleeping 1   Tired, decreased energy 2   Change in appetite 0   Feeling bad or failure about yourself  0   Trouble concentrating 0   Moving slowly or fidgety/restless 0   Suicidal thoughts 0   PHQ-9 Score 3   Difficult doing work/chores Not difficult at all      Psychosocial Evaluation and Intervention:   Psychosocial Re-Evaluation:   Vocational Rehabilitation: Provide vocational rehab assistance to qualifying candidates.   Vocational Rehab Evaluation & Intervention:     Vocational Rehab - 12/31/15 1504      Initial Vocational Rehab Evaluation & Intervention   Assessment shows need for Vocational Rehabilitation No      Education: Education Goals: Education classes will be provided on a weekly basis, covering required topics. Participant will state understanding/return demonstration of topics presented.  Learning Barriers/Preferences:     Learning Barriers/Preferences - 12/31/15 1503      Learning Barriers/Preferences   Learning Barriers None   Learning Preferences None      Education Topics: General Nutrition Guidelines/Fats and Fiber: -Group instruction provided by verbal, written material, models and posters to present the general guidelines for heart healthy nutrition. Gives an explanation and review of dietary fats and fiber.   Controlling Sodium/Reading Food Labels: -Group verbal and written material supporting the discussion of sodium use in heart healthy nutrition. Review and explanation with models, verbal and written materials for utilization of the food label.   Exercise Physiology & Risk Factors: - Group verbal and written instruction with models to review the exercise physiology of the cardiovascular system and associated critical values. Details cardiovascular  disease risk factors and the goals associated with each risk factor.   Aerobic Exercise & Resistance Training: - Gives group verbal and written discussion on the health impact of inactivity. On the components of aerobic and resistive training programs and the benefits of this training and how to safely progress through these programs.   Flexibility, Balance, General Exercise Guidelines: - Provides group verbal and written instruction on the benefits of flexibility and balance training programs. Provides general exercise guidelines with specific guidelines to those with heart or lung disease. Demonstration and skill practice provided. Flowsheet Row Cardiac Rehab from 01/04/2016 in [facility] Cardiac and Pulmonary Rehab  Date  01/04/16  Educator  [facility]  Instruction Review Code  2- meets goals/outcomes      Stress Management: - Provides group verbal and written instruction about the health risks of elevated stress, cause of high stress, and healthy ways to reduce stress.   Depression: - Provides group verbal and written instruction on the correlation between heart/lung disease and depressed mood, treatment options, and the stigmas associated with seeking treatment.   Anatomy & Physiology of the Heart: - Group verbal and written instruction and models provide basic cardiac anatomy and physiology, with the coronary electrical and arterial systems. Review of: AMI, Angina, Valve disease, Heart Failure, Cardiac Arrhythmia, Pacemakers, and the ICD.   Cardiac Procedures: - Group verbal and written instruction and models to describe the testing methods done to diagnose heart disease. Reviews the outcomes of the test results. Describes the treatment choices: Medical Management, Angioplasty, or Coronary Bypass Surgery.   Cardiac Medications: - Group verbal and written instruction to review commonly prescribed medications for heart disease. Reviews the medication, class of the drug, and side effects.  Includes the steps to properly store meds and maintain the prescription regimen.   Go Sex-Intimacy & Heart Disease, Get SMART - Goal Setting: - Group verbal and written instruction through game format to discuss heart disease and the return to sexual intimacy. Provides group verbal and written material to discuss and apply goal setting through the application of the S.M.A.R.T. Method.   Other Matters of the Heart: - Provides group verbal, written materials and models to describe Heart Failure, Angina, Valve Disease, and Diabetes in the realm of heart disease. Includes description of the disease process and treatment options available to the cardiac patient.   Exercise & Equipment Safety: - Individual verbal instruction and demonstration of equipment use and safety with use of the equipment. Flowsheet Row Cardiac Rehab from 01/04/2016 in [facility] Cardiac and Pulmonary Rehab  Date  12/31/15  Educator  C. Enterkin, RN  Instruction Review Code  1- partially meets, needs review/practice      Infection Prevention: - Provides verbal and written material to individual with discussion of infection control including proper hand washing and proper equipment cleaning during exercise session. Flowsheet Row Cardiac Rehab from 01/04/2016 in [facility] Cardiac and Pulmonary Rehab  Date  12/31/15  Educator  C. EnterkinRN  Instruction Review Code  2- meets goals/outcomes      Falls Prevention: - Provides verbal and written material to individual with discussion of falls prevention and safety. Flowsheet Row Cardiac Rehab from 01/04/2016 in [facility] Cardiac and Pulmonary Rehab  Date  12/31/15  Educator  C. Moro  Instruction Review Code  2- meets goals/outcomes      Diabetes: - Individual verbal and written instruction to review signs/symptoms of diabetes, desired ranges of glucose level fasting, after meals and with exercise. Advice that pre and post exercise glucose checks will be done for 3 sessions at  entry of program.    Knowledge Questionnaire Score:     Knowledge Questionnaire Score - 12/31/15 1503      Knowledge Questionnaire Score   Pre Score 28      Core Components/Risk Factors/Patient Goals at Admission:     Personal Goals and Risk Factors at Admission - 12/31/15 1509      Core Components/Risk Factors/Patient Goals on Admission    Weight Management Yes;Weight Maintenance   Intervention Weight Management: Develop a combined nutrition and exercise program designed to reach desired caloric intake, while maintaining appropriate intake of nutrient and fiber, sodium and  fats, and appropriate energy expenditure required for the weight goal.;Weight Management: Provide education and appropriate resources to help participant work on and attain dietary goals.   Expected Outcomes Weight Maintenance: Understanding of the daily nutrition guidelines, which includes 25-35% calories from fat, 7% or less cal from saturated fats, less than 200mg  cholesterol, less than 1.5gm of sodium, & 5 or more servings of fruits and vegetables daily   Increase Strength and Stamina Yes  Able to play golf and ride motorcycle again   Intervention Provide advice, education, support and counseling about physical activity/exercise needs.;Develop an individualized exercise prescription for aerobic and resistive training based on initial evaluation findings, risk stratification, comorbidities and participant's personal goals.   Expected Outcomes Achievement of increased cardiorespiratory fitness and enhanced flexibility, muscular endurance and strength shown through measurements of functional capacity and personal statement of participant.   Hypertension Yes   Intervention Provide education on lifestyle modifcations including regular physical activity/exercise, weight management, moderate sodium restriction and increased consumption of fresh fruit, vegetables, and low fat dairy, alcohol moderation, and smoking  cessation.;Monitor prescription use compliance.   Expected Outcomes Short Term: Continued assessment and intervention until BP is < 140/18mm HG in hypertensive participants. < 130/5mm HG in hypertensive participants with diabetes, heart failure or chronic kidney disease.;Long Term: Maintenance of blood pressure at goal levels.      Core Components/Risk Factors/Patient Goals Review:    Core Components/Risk Factors/Patient Goals at Discharge (Final Review):    ITP Comments:     ITP Comments    Row Name 12/31/15 1505 12/31/15 1506 12/31/15 1510 01/06/16 1153     ITP Comments "Al" is ready to start in Cardiac Rehab. Appt made for him and his wife with Cardiac Rehab REgistered [place].  Al majored in Engineer, maintenance at [employer]. He scored 28/28 on his Pre Cardiac REhb questionaire Al recently moved from [place] and was never really sick until he needed open heart surgery. He feels he did very well post op. Al's wife is a breast cancer survivor diagnosed 5 years ago and she now volunters at the [facility].  30 day review. Continue with ITP unless changes noted by Medical Director at signature of review.       Comments:

## 2016-01-07 DIAGNOSIS — I214 Non-ST elevation (NSTEMI) myocardial infarction: Secondary | ICD-10-CM | POA: Diagnosis not present

## 2016-01-07 DIAGNOSIS — Z951 Presence of aortocoronary bypass graft: Secondary | ICD-10-CM

## 2016-01-07 NOTE — Progress Notes (Signed)
Daily Session Note  Patient Details  Name: Clinton Lyons MRN: [id number] Date of Birth: 06-13-1955 Referring Provider:   Flowsheet Row Cardiac Rehab from 12/31/2015 in [facility] Cardiac and Pulmonary Rehab  Referring Provider  Isaias Cowman MD      Encounter Date: 01/07/2016  Check In:     Session Check In - 01/07/16 0856      Check-In   Location [facility]-Cardiac & Pulmonary Rehab   Staff Present Alberteen Sam, MA, ACSM RCEP, Exercise Physiologist;Andrick Rust Oletta Darter, BA, ACSM CEP, Exercise Physiologist;Carroll Enterkin, RN, BSN   Supervising physician immediately available to respond to emergencies See telemetry face sheet for immediately available ER MD   Medication changes reported     No   Fall or balance concerns reported    No   Warm-up and Cool-down Performed on first and last piece of equipment   Resistance Training Performed Yes   VAD Patient? No     Pain Assessment   Currently in Pain? No/denies   Multiple Pain Sites No         Goals Met:  Independence with exercise equipment Exercise tolerated well No report of cardiac concerns or symptoms Strength training completed today  Goals Unmet:  Not Applicable  Comments: Pt able to follow exercise prescription today without complaint.  Will continue to monitor for progression.    Dr. Emily Filbert is Medical Director for [place] and [facility].

## 2016-01-08 ENCOUNTER — Encounter: Payer: BLUE CROSS/BLUE SHIELD | Admitting: *Deleted

## 2016-01-08 DIAGNOSIS — I214 Non-ST elevation (NSTEMI) myocardial infarction: Secondary | ICD-10-CM | POA: Diagnosis not present

## 2016-01-08 DIAGNOSIS — Z951 Presence of aortocoronary bypass graft: Secondary | ICD-10-CM

## 2016-01-08 NOTE — Progress Notes (Addendum)
Daily Session Note  Patient Details  Name: Clinton Lyons MRN: [id number] Date of Birth: 10/01/1955 Referring Provider:   Flowsheet Row Cardiac Rehab from 12/31/2015 in [facility] Cardiac and Pulmonary Rehab  Referring Provider  Paraschos, Alexander MD      Encounter Date: 01/08/2016  Check In:     Session Check In - 01/08/16 0916      Check-In   Location [facility]-Cardiac & Pulmonary Rehab   Staff Present Carroll Enterkin, RN, BSN;Jessica Hawkins, MA, ACSM RCEP, Exercise Physiologist;Susanne Bice, RN, BSN, CCRP   Supervising physician immediately available to respond to emergencies See telemetry face sheet for immediately available ER MD   Medication changes reported     No   Fall or balance concerns reported    No   Warm-up and Cool-down Performed on first and last piece of equipment   Resistance Training Performed Yes   VAD Patient? No     Pain Assessment   Currently in Pain? No/denies         Goals Met:  Proper associated with RPD/PD & O2 Sat Exercise tolerated well  Goals Unmet:  Not Applicable  Comments:  Pt able to follow exercise prescription today without complaint.  Will continue to monitor for progression. Reviewed home exercise with pt today.  Pt plans to walk and use resistance training equipment at home for exercise.  Reviewed THR, pulse, RPE, sign and symptoms, NTG use, and when to call 911 or MD.  Also discussed weather considerations and indoor options.  Pt voiced understanding.    Dr. Mark Miller is Medical Director for [facility] and [facility]. 

## 2016-01-11 ENCOUNTER — Encounter: Payer: BLUE CROSS/BLUE SHIELD | Admitting: *Deleted

## 2016-01-11 DIAGNOSIS — I214 Non-ST elevation (NSTEMI) myocardial infarction: Secondary | ICD-10-CM

## 2016-01-11 DIAGNOSIS — Z951 Presence of aortocoronary bypass graft: Secondary | ICD-10-CM

## 2016-01-11 NOTE — Progress Notes (Signed)
Daily Session Note  Patient Details  Name: Clinton Lyons MRN: [id number] Date of Birth: 1955-10-21 Referring Provider:   Flowsheet Row Cardiac Rehab from 12/31/2015 in [facility] Cardiac and Pulmonary Rehab  Referring Provider  Isaias Cowman MD      Encounter Date: 01/11/2016  Check In:     Session Check In - 01/11/16 0800      Check-In   Location [facility]-Cardiac & Pulmonary Rehab   Staff Present Gerlene Burdock, RN, Moises Blood, BS, ACSM CEP, Exercise Physiologist;Jessica Luan [place], [place], ACSM RCEP, Exercise Physiologist   Supervising physician immediately available to respond to emergencies See telemetry face sheet for immediately available ER MD   Medication changes reported     No   Fall or balance concerns reported    No   Warm-up and Cool-down Performed on first and last piece of equipment   Resistance Training Performed Yes   VAD Patient? No     Pain Assessment   Currently in Pain? No/denies   Multiple Pain Sites No         Goals Met:  Independence with exercise equipment Exercise tolerated well No report of cardiac concerns or symptoms Strength training completed today  Goals Unmet:  Not Applicable  Comments: Pt able to follow exercise prescription today without complaint.  Will continue to monitor for progression.    Dr. Emily Filbert is Medical Director for [place] and [facility].

## 2016-01-20 DIAGNOSIS — I214 Non-ST elevation (NSTEMI) myocardial infarction: Secondary | ICD-10-CM

## 2016-01-20 DIAGNOSIS — Z951 Presence of aortocoronary bypass graft: Secondary | ICD-10-CM

## 2016-01-20 NOTE — Progress Notes (Signed)
Daily Session Note  Patient Details  Name: Clinton Lyons MRN: [id number] Date of Birth: 03/31/1955 Referring Provider:   Flowsheet Row Cardiac Rehab from 12/31/2015 in [facility] Cardiac and Pulmonary Rehab  Referring Provider  Isaias Cowman MD      Encounter Date: 01/20/2016  Check In:     Session Check In - 01/20/16 0809      Check-In   Location [facility]-Cardiac & Pulmonary Rehab   Staff Present Heath Lark, RN, BSN, CCRP;Jessica Luan Pulling, MA, ACSM RCEP, Exercise Physiologist;Amanda Oletta Darter, BA, ACSM CEP, Exercise Physiologist   Supervising physician immediately available to respond to emergencies See telemetry face sheet for immediately available ER MD   Medication changes reported     No   Fall or balance concerns reported    No   Warm-up and Cool-down Performed on first and last piece of equipment   Resistance Training Performed Yes   VAD Patient? No     Pain Assessment   Currently in Pain? No/denies   Multiple Pain Sites No         Goals Met:  Independence with exercise equipment Exercise tolerated well No report of cardiac concerns or symptoms Strength training completed today  Goals Unmet:  Not Applicable  Comments: Pt able to follow exercise prescription today without complaint.  Will continue to monitor for progression.    Dr. Emily Filbert is Medical Director for [place] and [facility].

## 2016-01-22 ENCOUNTER — Encounter: Payer: BLUE CROSS/BLUE SHIELD | Admitting: *Deleted

## 2016-01-22 DIAGNOSIS — I214 Non-ST elevation (NSTEMI) myocardial infarction: Secondary | ICD-10-CM | POA: Diagnosis not present

## 2016-01-22 NOTE — Progress Notes (Signed)
Daily Session Note  Patient Details  Name: Clinton Lyons MRN: [id number] Date of Birth: [birth year] Referring Provider:   Flowsheet Row Cardiac Rehab from 12/31/2015 in [facility] Cardiac and Pulmonary Rehab  Referring Provider  Isaias Cowman MD      Encounter Date: 01/22/2016  Check In:     Session Check In - 01/22/16 0904      Check-In   Location [facility]-Cardiac & Pulmonary Rehab   Staff Present Gerlene Burdock, RN, BSN;Susanne Bice, RN, BSN, CCRP;Jessica Luan Pulling, MA, ACSM RCEP, Exercise Physiologist   Medication changes reported     No   Fall or balance concerns reported    No   Warm-up and Cool-down Performed on first and last piece of equipment   Resistance Training Performed Yes   VAD Patient? No     Pain Assessment   Currently in Pain? No/denies         Goals Met:  Proper associated with RPD/PD & O2 Sat Exercise tolerated well  Goals Unmet:  Not Applicable  Comments:     Dr. Emily Filbert is Medical Director for [place] and [facility].

## 2016-01-27 ENCOUNTER — Encounter: Payer: BLUE CROSS/BLUE SHIELD | Attending: Cardiology | Admitting: *Deleted

## 2016-01-27 DIAGNOSIS — I214 Non-ST elevation (NSTEMI) myocardial infarction: Secondary | ICD-10-CM | POA: Insufficient documentation

## 2016-01-27 DIAGNOSIS — Z951 Presence of aortocoronary bypass graft: Secondary | ICD-10-CM | POA: Diagnosis not present

## 2016-01-27 NOTE — Progress Notes (Signed)
Daily Session Note  Patient Details  Name: Sparsh Callens MRN: [id number] Date of Birth: 1956-02-23 Referring Provider:   Flowsheet Row Cardiac Rehab from 12/31/2015 in [facility] Cardiac and Pulmonary Rehab  Referring Provider  Isaias Cowman MD      Encounter Date: 01/27/2016  Check In:     Session Check In - 01/27/16 0850      Check-In   Location [facility]-Cardiac & Pulmonary Rehab   Staff Present Alberteen Sam, MA, ACSM RCEP, Exercise Physiologist;Susanne Bice, RN, BSN, Lance Sell, BA, ACSM CEP, Exercise Physiologist   Supervising physician immediately available to respond to emergencies See telemetry face sheet for immediately available ER MD   Medication changes reported     No   Fall or balance concerns reported    No   Warm-up and Cool-down Performed on first and last piece of equipment   Resistance Training Performed Yes   VAD Patient? No     Pain Assessment   Currently in Pain? No/denies   Multiple Pain Sites No         Goals Met:  Independence with exercise equipment Exercise tolerated well No report of cardiac concerns or symptoms Strength training completed today  Goals Unmet:  Not Applicable  Comments: Pt able to follow exercise prescription today without complaint.  Will continue to monitor for progression.    Dr. Emily Filbert is Medical Director for [place] and [facility].

## 2016-01-28 ENCOUNTER — Encounter: Payer: BLUE CROSS/BLUE SHIELD | Admitting: *Deleted

## 2016-01-28 DIAGNOSIS — Z951 Presence of aortocoronary bypass graft: Secondary | ICD-10-CM

## 2016-01-28 DIAGNOSIS — I214 Non-ST elevation (NSTEMI) myocardial infarction: Secondary | ICD-10-CM | POA: Diagnosis not present

## 2016-01-28 NOTE — Progress Notes (Signed)
Daily Session Note  Patient Details  Name: Clinton Lyons MRN: [id number] Date of Birth: 01/27/1956 Referring Provider:   Flowsheet Row Cardiac Rehab from 12/31/2015 in [facility] Cardiac and Pulmonary Rehab  Referring Provider  Isaias Cowman MD      Encounter Date: 01/28/2016  Check In:     Session Check In - 01/28/16 1011      Check-In   Location [facility]-Cardiac & Pulmonary Rehab   Staff Present Alberteen Sam, MA, ACSM RCEP, Exercise Physiologist;Amanda Oletta Darter, BA, ACSM CEP, Exercise Physiologist;Susanne Bice, RN, BSN, CCRP;Other   Supervising physician immediately available to respond to emergencies See telemetry face sheet for immediately available ER MD   Medication changes reported     No   Fall or balance concerns reported    No   Warm-up and Cool-down Performed on first and last piece of equipment   Resistance Training Performed Yes   VAD Patient? No     Pain Assessment   Currently in Pain? No/denies   Multiple Pain Sites No           Exercise Prescription Changes - 01/27/16 1500      Exercise Review   Progression Yes     Response to Exercise   Blood Pressure (Admit) 114/60   Blood Pressure (Exercise) 122/70   Blood Pressure (Exit) 92/64   Heart Rate (Admit) 55 bpm   Heart Rate (Exercise) 129 bpm   Heart Rate (Exit) 99 bpm   Rating of Perceived Exertion (Exercise) 11   Symptoms none   Comments Home Exercise Guidelines given 01/08/16   Duration Progress to 45 minutes of aerobic exercise without signs/symptoms of physical distress   Intensity THRR unchanged     Progression   Progression Continue to progress workloads to maintain intensity without signs/symptoms of physical distress.   Average METs 4.73     Resistance Training   Training Prescription Yes   Weight 5 lbs   Reps 10-12     Interval Training   Interval Training Yes   Equipment NuStep   Comments 47mn off 30 sec on     Treadmill   MPH 3.5   Grade 7   Minutes 15   METs 7.1     Elliptical   Level 3   Speed 3.8   Minutes 15     T5 Nustep   Level 6   Minutes 15   METs 2.4     Home Exercise Plan   Plans to continue exercise at Home  walking , hand weights, balance ball, bands   Frequency Add 3 additional days to program exercise sessions.      Goals Met:  Independence with exercise equipment Exercise tolerated well No report of cardiac concerns or symptoms Strength training completed today  Goals Unmet:  Not Applicable  Comments: Pt able to follow exercise prescription today without complaint.  Will continue to monitor for progression.   Dr. MEmily Filbertis Medical Director for [facility].

## 2016-01-29 ENCOUNTER — Encounter: Payer: BLUE CROSS/BLUE SHIELD | Admitting: *Deleted

## 2016-01-29 DIAGNOSIS — Z951 Presence of aortocoronary bypass graft: Secondary | ICD-10-CM

## 2016-01-29 DIAGNOSIS — I214 Non-ST elevation (NSTEMI) myocardial infarction: Secondary | ICD-10-CM

## 2016-01-29 NOTE — Progress Notes (Signed)
Daily Session Note  Patient Details  Name: Clinton Lyons MRN: [id number] Date of Birth: [birth year] Referring Provider:   Flowsheet Row Cardiac Rehab from 12/31/2015 in [facility] Cardiac and Pulmonary Rehab  Referring Provider  Isaias Cowman MD      Encounter Date: 01/29/2016  Check In:     Session Check In - 01/29/16 1010      Check-In   Location [facility]-Cardiac & Pulmonary Rehab   Staff Present Heath Lark, RN, BSN, CCRP;Ladonya Jerkins [place], MA, ACSM RCEP, Exercise Physiologist;Laureen Owens Shark, BS, RRT, Respiratory Therapist;Other   Supervising physician immediately available to respond to emergencies See telemetry face sheet for immediately available ER MD   Medication changes reported     No   Fall or balance concerns reported    No   Warm-up and Cool-down Performed on first and last piece of equipment   Resistance Training Performed Yes   VAD Patient? No     Pain Assessment   Currently in Pain? No/denies   Multiple Pain Sites No         Goals Met:  Independence with exercise equipment Exercise tolerated well No report of cardiac concerns or symptoms Strength training completed today  Goals Unmet:  Not Applicable  Comments: Pt able to follow exercise prescription today without complaint.  Will continue to monitor for progression.    Dr. Emily Filbert is Medical Director for [place] and [facility].

## 2016-02-02 ENCOUNTER — Encounter: Payer: BLUE CROSS/BLUE SHIELD | Admitting: *Deleted

## 2016-02-02 DIAGNOSIS — I214 Non-ST elevation (NSTEMI) myocardial infarction: Secondary | ICD-10-CM

## 2016-02-02 DIAGNOSIS — Z951 Presence of aortocoronary bypass graft: Secondary | ICD-10-CM

## 2016-02-02 NOTE — Progress Notes (Signed)
Daily Session Note  Patient Details  Name: Clinton Lyons MRN: [id number] Date of Birth: 1956/03/14 Referring Provider:   Flowsheet Row Cardiac Rehab from 12/31/2015 in [facility] Cardiac and Pulmonary Rehab  Referring Provider  Isaias Cowman MD      Encounter Date: 02/02/2016  Check In:     Session Check In - 02/02/16 0844      Check-In   Location [facility]-Cardiac & Pulmonary Rehab   Staff Present Alberteen Sam, MA, ACSM RCEP, Exercise Physiologist;Susanne Bice, RN, BSN, CCRP;Laureen Owens Shark, BS, RRT, Respiratory Therapist   Supervising physician immediately available to respond to emergencies See telemetry face sheet for immediately available ER MD   Medication changes reported     No   Fall or balance concerns reported    No   Warm-up and Cool-down Performed on first and last piece of equipment   Resistance Training Performed Yes   VAD Patient? No     Pain Assessment   Currently in Pain? No/denies   Multiple Pain Sites No         Goals Met:  Independence with exercise equipment Exercise tolerated well No report of cardiac concerns or symptoms Strength training completed today  Goals Unmet:  Not Applicable  Comments: Pt able to follow exercise prescription today without complaint.  Will continue to monitor for progression.    Dr. Emily Filbert is Medical Director for [place] and [facility].

## 2016-02-03 ENCOUNTER — Encounter: Payer: Self-pay | Admitting: *Deleted

## 2016-02-03 DIAGNOSIS — I214 Non-ST elevation (NSTEMI) myocardial infarction: Secondary | ICD-10-CM | POA: Diagnosis not present

## 2016-02-03 DIAGNOSIS — Z951 Presence of aortocoronary bypass graft: Secondary | ICD-10-CM

## 2016-02-03 NOTE — Progress Notes (Signed)
Cardiac Individual Treatment Plan  Patient Details  Name: Clinton Lyons MRN: [id number] Date of Birth: August 30, 1955 Referring Provider:   Flowsheet Row Cardiac Rehab from 12/31/2015 in [facility] Cardiac and Pulmonary Rehab  Referring Provider  Isaias Cowman MD      Initial Encounter Date:  Flowsheet Row Cardiac Rehab from 12/31/2015 in [facility] Cardiac and Pulmonary Rehab  Date  12/31/15  Referring Provider  Isaias Cowman MD      Visit Diagnosis: NSTEMI (non-ST elevated myocardial infarction) ([place])  S/P CABG x 3  Patient's Home Medications on Admission:  Current Outpatient Prescriptions:  .  acetaminophen (TYLENOL) 325 MG tablet, Take 2 tablets (650 mg total) by mouth every 6 (six) hours as needed for mild pain., Disp: , Rfl:  .  amiodarone (PACERONE) 200 MG tablet, Take 1 tablet (200 mg total) by mouth daily. (Patient not taking: Reported on 12/31/2015), Disp: 30 tablet, Rfl: 0 .  aspirin EC 325 MG EC tablet, Take 1 tablet (325 mg total) by mouth daily., Disp: 30 tablet, Rfl: 0 .  atorvastatin (LIPITOR) 40 MG tablet, Take 1 tablet (40 mg total) by mouth daily at 6 PM., Disp: 30 tablet, Rfl: 1 .  Cholecalciferol (VITAMIN D3) 2000 units capsule, Take 4,000 Units by mouth every morning. , Disp: , Rfl:  .  glucosamine-chondroitin 500-400 MG tablet, Take 1 tablet by mouth every morning., Disp: , Rfl:  .  latanoprost (XALATAN) 0.005 % ophthalmic solution, Place 1 drop into the right eye at bedtime., Disp: , Rfl:  .  lisinopril (PRINIVIL,ZESTRIL) 5 MG tablet, , Disp: , Rfl:  .  metoprolol tartrate (LOPRESSOR) 25 MG tablet, , Disp: , Rfl:  .  Multiple Vitamins-Minerals (PX ADVANCED FORMULA MULTIVITS) TABS, Take 1 tablet by mouth every morning., Disp: , Rfl:  .  traMADol (ULTRAM) 50 MG tablet, Take 1 tablet (50 mg total) by mouth every 4 (four) hours as needed for moderate pain., Disp: 28 tablet, Rfl: 0  Past Medical History: Past Medical History:  Diagnosis Date  . CAD (coronary  artery disease) 11/17/2015  . Essential hypertension 11/15/2015  . Glaucoma   . NSTEMI (non-ST elevated myocardial infarction) ([place]) 11/16/2015  . 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 Never Smoker  Smokeless Tobacco  . Never Used    Labs: Recent Review Flowsheet Data    Labs for ITP Cardiac and Pulmonary Rehab Latest Ref Rng & Units 11/19/2015 11/19/2015 11/20/2015 11/20/2015 11/20/2015   Cholestrol 0 - 200 mg/dL - - - - -   LDLCALC 0 - 99 mg/dL - - - - -   HDL >40 mg/dL - - - - -   Trlycerides <150 mg/dL - - - - -   Hemoglobin A1c 4.8 - 5.6 % - - - - -   PHART 7.350 - 7.450 7.424 7.317(L) 7.395 7.410 -   PCO2ART 35.0 - 45.0 mmHg 34.7(L) 47.0(H) 40.4 38.7 -   HCO3 20.0 - 24.0 mEq/L 22.7 24.2(H) 24.6(H) 24.3(H) -   TCO2 0 - 100 mmol/L '24 26 26 25 22   ' ACIDBASEDEF 0.0 - 2.0 mmol/L 1.0 2.0 - - -   O2SAT % 98.0 97.0 94.0 94.0 -       Exercise Target Goals:    Exercise Program Goal: Individual exercise prescription set with THRR, safety & activity barriers. Participant demonstrates ability to understand and report RPE using BORG  scale, to self-measure pulse accurately, and to acknowledge the importance of the exercise prescription.  Exercise Prescription Goal: Starting with aerobic activity 30 plus minutes a day, 3 days per week for initial exercise prescription. Provide home exercise prescription and guidelines that participant acknowledges understanding prior to discharge.  Activity Barriers & Risk Stratification:     Activity Barriers & Cardiac Risk Stratification - 12/31/15 1502      Activity Barriers & Cardiac Risk Stratification   Activity Barriers None   Cardiac Risk Stratification High      6 Minute Walk:     6 Minute Walk    Row Name 12/31/15 1504         6 Minute Walk   Phase Initial     Distance 1660 feet     Walk Time 6 minutes     # of Rest Breaks 0     MPH 3.14      METS 3.89     RPE 7     VO2 Peak 13.62     Symptoms No     Resting HR 61 bpm     Resting BP 102/60     Max Ex. HR 104 bpm     Max Ex. BP 126/60     2 Minute Post BP 114/60        Initial Exercise Prescription:     Initial Exercise Prescription - 12/31/15 1500      Date of Initial Exercise RX and Referring Provider   Date 12/31/15   Referring Provider Isaias Cowman MD     Treadmill   MPH 3   Grade 1.5   Minutes 15   METs 3.92     Elliptical   Level 2   Speed 3.8   Minutes 15   METs 6.5     T5 Nustep   Level 3   Watts --  80-100 spm   Minutes 15   METs 3     Prescription Details   Frequency (times per week) 3   Duration Progress to 45 minutes of aerobic exercise without signs/symptoms of physical distress     Intensity   THRR 40-80% of Max Heartrate 99-139   Ratings of Perceived Exertion 11-15   Perceived Dyspnea 0-4     Progression   Progression Continue to progress workloads to maintain intensity without signs/symptoms of physical distress.     Resistance Training   Training Prescription Yes   Weight 4 lbs   Reps 10-12      Perform Capillary Blood Glucose checks as needed.  Exercise Prescription Changes:     Exercise Prescription Changes    Row Name 12/31/15 1500 01/13/16 1400 01/27/16 1500         Exercise Review   Progression -  walk test results Yes Yes       Response to Exercise   Blood Pressure (Admit) 102/60 110/64 114/60     Blood Pressure (Exercise) 126/60 124/70 122/70     Blood Pressure (Exit) 114/60 100/64 92/64     Heart Rate (Admit) 61 bpm 71 bpm 55 bpm     Heart Rate (Exercise) 104 bpm 117 bpm 129 bpm     Heart Rate (Exit)  - 86 bpm 99 bpm     Oxygen Saturation (Admit) 100 %  -  -     Oxygen Saturation (Exercise) 96 %  -  -     Rating of Perceived Exertion (Exercise) '7 12 11     ' Symptoms  none none none     Comments  - Home Exercise Guidelines given 01/08/16 Home Exercise Guidelines given 01/08/16     Duration   - Progress to 45 minutes of aerobic exercise without signs/symptoms of physical distress Progress to 45 minutes of aerobic exercise without signs/symptoms of physical distress     Intensity  - THRR unchanged THRR unchanged       Progression   Progression  - Continue to progress workloads to maintain intensity without signs/symptoms of physical distress. Continue to progress workloads to maintain intensity without signs/symptoms of physical distress.     Average METs  - 4.27 4.73       Resistance Training   Training Prescription  - Yes Yes     Weight  - 5 lbs 5 lbs     Reps  - 10-12 10-12       Interval Training   Interval Training  - No Yes     Equipment  -  - NuStep     Comments  -  - 24mn off 30 sec on       Treadmill   MPH  - 3 3.5     Grade  - 1.5 7     Minutes  - 15 15     METs  - 3.92 7.1       Elliptical   Level  - 3 3     Speed  - 3.8 3.8     Minutes  - 15 15     METs  - 6.5  -       T5 Nustep   Level  - 6 6     Minutes  - 15 15     METs  - 2.4 2.4       Home Exercise Plan   Plans to continue exercise at  - Home  walking , hand weights, balance ball, bands Home  walking , hand weights, balance ball, bands     Frequency  - Add 3 additional days to program exercise sessions. Add 3 additional days to program exercise sessions.        Exercise Comments:     Exercise Comments    Row Name 01/04/16 0195010/13/17 1016 01/13/16 1430 01/27/16 1503 01/29/16 1034   Exercise Comments First full day of exercise!  Patient was oriented to gym and equipment including functions, settings, policies, and procedures.  Patient's individual exercise prescription and treatment plan were reviewed.  All starting workloads were established based on the results of the 6 minute walk test done at initial orientation visit.  The plan for exercise progression was also introduced and progression will be customized based on patient's performance and goals. Reviewed home exercise with pt today.   Pt plans to walk and use resistance training equipment at home for exercise.  Reviewed THR, pulse, RPE, sign and symptoms, NTG use, and when to call 911 or MD.  Also discussed weather considerations and indoor options.  Pt voiced understanding. Al is off to a good start with exercise.  He is already exercising some at home.  He is on vacation for the next week visiting his daughter.  Al hopes to start HIIT once he returns.  We will continue to monitor his progression. Al continues to do well with exercise.  He is enjoying the HIIT.  We will continue to monitor his progress. Reviewed METs average and discussed progression with pt today.      Discharge Exercise Prescription (Final Exercise  Prescription Changes):     Exercise Prescription Changes - 01/27/16 1500      Exercise Review   Progression Yes     Response to Exercise   Blood Pressure (Admit) 114/60   Blood Pressure (Exercise) 122/70   Blood Pressure (Exit) 92/64   Heart Rate (Admit) 55 bpm   Heart Rate (Exercise) 129 bpm   Heart Rate (Exit) 99 bpm   Rating of Perceived Exertion (Exercise) 11   Symptoms none   Comments Home Exercise Guidelines given 01/08/16   Duration Progress to 45 minutes of aerobic exercise without signs/symptoms of physical distress   Intensity THRR unchanged     Progression   Progression Continue to progress workloads to maintain intensity without signs/symptoms of physical distress.   Average METs 4.73     Resistance Training   Training Prescription Yes   Weight 5 lbs   Reps 10-12     Interval Training   Interval Training Yes   Equipment NuStep   Comments 71mn off 30 sec on     Treadmill   MPH 3.5   Grade 7   Minutes 15   METs 7.1     Elliptical   Level 3   Speed 3.8   Minutes 15     T5 Nustep   Level 6   Minutes 15   METs 2.4     Home Exercise Plan   Plans to continue exercise at Home  walking , hand weights, balance ball, bands   Frequency Add 3 additional days to program  exercise sessions.      Nutrition:  Target Goals: Understanding of nutrition guidelines, daily intake of sodium <15020m cholesterol <20027mcalories 30% from fat and 7% or less from saturated fats, daily to have 5 or more servings of fruits and vegetables.  Biometrics:     Pre Biometrics - 12/31/15 1510      Pre Biometrics   Height 5' 9.5" (1.765 m)   Weight 201 lb 6.4 oz (91.4 kg)   Waist Circumference 38 inches   Hip Circumference 39 inches   Waist to Hip Ratio 0.97 %   BMI (Calculated) 29.4   Single Leg Stand 30 seconds       Nutrition Therapy Plan and Nutrition Goals:     Nutrition Therapy & Goals - 01/22/16 1201      Nutrition Therapy   Diet Instructed patient on a meal plan based on heart healthy dietary guidelines   Drug/Food Interactions Statins/Certain Fruits   Protein (specify units) 8   Fiber 30 grams   Whole Grain Foods 3 servings   Saturated Fats 12 max. grams   Fruits and Vegetables 5 servings/day   Sodium 1500 grams     Personal Nutrition Goals   Personal Goal #1 Increase water intake to 3(16oz) bottles per day.   Personal Goal #2 Strive for 8 servings of fruits and vegetables per day   Personal Goal #3 Use myfitnesspal app or caloriekingapp to look up nutrition information especially for meals eaten "out".   Personal Goal #4 Add more dried beans to diet     Intervention Plan   Intervention Prescribe, educate and counsel regarding individualized specific dietary modifications aiming towards targeted core components such as weight, hypertension, lipid management, diabetes, heart failure and other comorbidities.;Nutrition handout(s) given to patient.   Expected Outcomes Short Term Goal: Understand basic principles of dietary content, such as calories, fat, sodium, cholesterol and nutrients.      Nutrition Discharge: Rate Your Plate  Scores:     Nutrition Assessments - 01/22/16 1205      Rate Your Plate Scores   Pre Score 75   Pre Score % 83 %       Nutrition Goals Re-Evaluation:     Nutrition Goals Re-Evaluation    Row Name 01/20/16 1410             Personal Goal #1 Re-Evaluation   Personal Goal #1 Increase water intake to 3(16oz) bottles daily.       Goal Progress Seen Met       Comments Al is drinking 3-4 16 oz bottles daily as well as his coffee and juice/milk.         Personal Goal #2 Re-Evaluation   Personal Goal #2 Strive for 8 servings of fruits/vegetables daily       Goal Progress Seen Met       Comments Getting between 6-8 servings per day.         Personal Goal #3 Re-Evaluation   Personal Goal #3 Use myfitness pal or calorieking to look up nutrition information especially when eating "out".       Goal Progress Seen No       Comments Not using app, but making more mindful choices         Personal Goal #4 Re-Evaluation   Personal Goal #4 Add more beans to diet.       Goal Progress Seen Yes       Comments Loves chili, but not a big bean eater.  He is trying to eat a few more.         Weight   Current Weight 202 lb (91.6 kg)         Intervention Plan   Intervention Nutrition handout(s) given to patient.       Comments Al has met his goals or gotten as close to it as possible.          Psychosocial: Target Goals: Acknowledge presence or absence of depression, maximize coping skills, provide positive support system. Participant is able to verbalize types and ability to use techniques and skills needed for reducing stress and depression.  Initial Review & Psychosocial Screening:     Initial Psych Review & Screening - 12/31/15 [place]? Yes   Comments Al recently moved from [place] and was never really sick until he needed open heart surgery. He feels he did very well post op. Al's wife is a breast cancer survivor diagnosed 5 years ago and she now volunters at the [facility].       Quality of Life Scores:     Quality of Life - 12/31/15 1510      Quality  of Life Scores   Health/Function Pre 27.5 %   Socioeconomic Pre 28.93 %   Psych/Spiritual Pre 30 %   Family Pre 27.6 %   GLOBAL Pre 28.35 %      PHQ-9: Recent Review Flowsheet Data    Depression screen [facility] 2/9 12/31/2015   Decreased Interest 0   Down, Depressed, Hopeless 0   PHQ - 2 Score 0   Altered sleeping 1   Tired, decreased energy 2   Change in appetite 0   Feeling bad or failure about yourself  0   Trouble concentrating 0   Moving slowly or fidgety/restless 0   Suicidal thoughts 0   PHQ-9 Score 3   Difficult doing work/chores  Not difficult at all      Psychosocial Evaluation and Intervention:     Psychosocial Evaluation - 01/11/16 0908      Psychosocial Evaluation & Interventions   Interventions Encouraged to exercise with the program and follow exercise prescription   Comments Counselor met with Mr. Whyte (Mr A) for initial psychosocial evaluation.  He is a well-adjusted 60 year old who had triple by-pass surgery this past August.  He has a strong support system with a spouse of 76 years and her family who live close by, as well as (2) adult daughters who are away at college out of state.  He reports other than his heart he has very few health issues, with the exception of glaucoma.  Mr. Loni Muse has had difficulty sleeping over the past 2 years reporting interrupted sleep most nights.  He has tried OTC Melatonin while in the hospital but not since D/C.  Counselor asked Mr. A to check with his Dr. or Pharmacist about Sustained Release Melatonin to help him be able to get back to sleep more easily if awakened in the night.  Mr. Loni Muse states his appetite is "excellent" and he has no history or current symptoms of depression or anxiety.  He reports typically being in a positive mood and other than concerns on occasion about his daughters away at school, he has minimal stress.  Mr. Loni Muse has goals to increase his strength and stamina and to just get "heart Healthy" so he can get back on  the golf course.  He plans to join a gym following completion of this program to maintain consistency in his workout program.  Counselor will be following with Mr. A while in this program and check on him about his sleep as well.         Psychosocial Re-Evaluation:   Vocational Rehabilitation: Provide vocational rehab assistance to qualifying candidates.   Vocational Rehab Evaluation & Intervention:     Vocational Rehab - 12/31/15 1504      Initial Vocational Rehab Evaluation & Intervention   Assessment shows need for Vocational Rehabilitation No      Education: Education Goals: Education classes will be provided on a weekly basis, covering required topics. Participant will state understanding/return demonstration of topics presented.  Learning Barriers/Preferences:     Learning Barriers/Preferences - 12/31/15 1503      Learning Barriers/Preferences   Learning Barriers None   Learning Preferences None      Education Topics: General Nutrition Guidelines/Fats and Fiber: -Group instruction provided by verbal, written material, models and posters to present the general guidelines for heart healthy nutrition. Gives an explanation and review of dietary fats and fiber.   Controlling Sodium/Reading Food Labels: -Group verbal and written material supporting the discussion of sodium use in heart healthy nutrition. Review and explanation with models, verbal and written materials for utilization of the food label.   Exercise Physiology & Risk Factors: - Group verbal and written instruction with models to review the exercise physiology of the cardiovascular system and associated critical values. Details cardiovascular disease risk factors and the goals associated with each risk factor.   Aerobic Exercise & Resistance Training: - Gives group verbal and written discussion on the health impact of inactivity. On the components of aerobic and resistive training programs and the benefits  of this training and how to safely progress through these programs.   Flexibility, Balance, General Exercise Guidelines: - Provides group verbal and written instruction on the benefits of flexibility and balance training  programs. Provides general exercise guidelines with specific guidelines to those with heart or lung disease. Demonstration and skill practice provided. Flowsheet Row Cardiac Rehab from 02/02/2016 in [facility] Cardiac and Pulmonary Rehab  Date  01/04/16  Educator  [facility]  Instruction Review Code  2- meets goals/outcomes      Stress Management: - Provides group verbal and written instruction about the health risks of elevated stress, cause of high stress, and healthy ways to reduce stress.   Depression: - Provides group verbal and written instruction on the correlation between heart/lung disease and depressed mood, treatment options, and the stigmas associated with seeking treatment.   Anatomy & Physiology of the Heart: - Group verbal and written instruction and models provide basic cardiac anatomy and physiology, with the coronary electrical and arterial systems. Review of: AMI, Angina, Valve disease, Heart Failure, Cardiac Arrhythmia, Pacemakers, and the ICD. Flowsheet Row Cardiac Rehab from 02/02/2016 in [facility] Cardiac and Pulmonary Rehab  Date  01/11/16  Educator  CE  Instruction Review Code  2- meets goals/outcomes      Cardiac Procedures: - Group verbal and written instruction and models to describe the testing methods done to diagnose heart disease. Reviews the outcomes of the test results. Describes the treatment choices: Medical Management, Angioplasty, or Coronary Bypass Surgery.   Cardiac Medications: - Group verbal and written instruction to review commonly prescribed medications for heart disease. Reviews the medication, class of the drug, and side effects. Includes the steps to properly store meds and maintain the prescription regimen. Flowsheet Row Cardiac Rehab  from 02/02/2016 in [facility] Cardiac and Pulmonary Rehab  Date  01/20/16  Educator  SB  Instruction Review Code  2- meets goals/outcomes      Go Sex-Intimacy & Heart Disease, Get SMART - Goal Setting: - Group verbal and written instruction through game format to discuss heart disease and the return to sexual intimacy. Provides group verbal and written material to discuss and apply goal setting through the application of the S.M.A.R.T. Method.   Other Matters of the Heart: - Provides group verbal, written materials and models to describe Heart Failure, Angina, Valve Disease, and Diabetes in the realm of heart disease. Includes description of the disease process and treatment options available to the cardiac patient. Flowsheet Row Cardiac Rehab from 02/02/2016 in [facility] Cardiac and Pulmonary Rehab  Date  01/11/16 Marisue Humble 1]  Educator  CE  Instruction Review Code  2- meets goals/outcomes      Exercise & Equipment Safety: - Individual verbal instruction and demonstration of equipment use and safety with use of the equipment. Flowsheet Row Cardiac Rehab from 02/02/2016 in [facility] Cardiac and Pulmonary Rehab  Date  12/31/15  Educator  C. Enterkin, RN  Instruction Review Code  1- partially meets, needs review/practice      Infection Prevention: - Provides verbal and written material to individual with discussion of infection control including proper hand washing and proper equipment cleaning during exercise session. Flowsheet Row Cardiac Rehab from 02/02/2016 in [facility] Cardiac and Pulmonary Rehab  Date  12/31/15  Educator  C. EnterkinRN  Instruction Review Code  2- meets goals/outcomes      Falls Prevention: - Provides verbal and written material to individual with discussion of falls prevention and safety. Flowsheet Row Cardiac Rehab from 02/02/2016 in [facility] Cardiac and Pulmonary Rehab  Date  12/31/15  Educator  C. EnterkinRN  Instruction Review Code  2- meets goals/outcomes      Diabetes: -  Individual verbal and written instruction to review  signs/symptoms of diabetes, desired ranges of glucose level fasting, after meals and with exercise. Advice that pre and post exercise glucose checks will be done for 3 sessions at entry of program.    Knowledge Questionnaire Score:     Knowledge Questionnaire Score - 12/31/15 1503      Knowledge Questionnaire Score   Pre Score 28      Core Components/Risk Factors/Patient Goals at Admission:     Personal Goals and Risk Factors at Admission - 12/31/15 1509      Core Components/Risk Factors/Patient Goals on Admission    Weight Management Yes;Weight Maintenance   Intervention Weight Management: Develop a combined nutrition and exercise program designed to reach desired caloric intake, while maintaining appropriate intake of nutrient and fiber, sodium and fats, and appropriate energy expenditure required for the weight goal.;Weight Management: Provide education and appropriate resources to help participant work on and attain dietary goals.   Expected Outcomes Weight Maintenance: Understanding of the daily nutrition guidelines, which includes 25-35% calories from fat, 7% or less cal from saturated fats, less than 281m cholesterol, less than 1.5gm of sodium, & 5 or more servings of fruits and vegetables daily   Increase Strength and Stamina Yes  Able to play golf and ride motorcycle again   Intervention Provide advice, education, support and counseling about physical activity/exercise needs.;Develop an individualized exercise prescription for aerobic and resistive training based on initial evaluation findings, risk stratification, comorbidities and participant's personal goals.   Expected Outcomes Achievement of increased cardiorespiratory fitness and enhanced flexibility, muscular endurance and strength shown through measurements of functional capacity and personal statement of participant.   Hypertension Yes   Intervention Provide education  on lifestyle modifcations including regular physical activity/exercise, weight management, moderate sodium restriction and increased consumption of fresh fruit, vegetables, and low fat dairy, alcohol moderation, and smoking cessation.;Monitor prescription use compliance.   Expected Outcomes Short Term: Continued assessment and intervention until BP is < 140/927mHG in hypertensive participants. < 130/8067mG in hypertensive participants with diabetes, heart failure or chronic kidney disease.;Long Term: Maintenance of blood pressure at goal levels.      Core Components/Risk Factors/Patient Goals Review:      Goals and Risk Factor Review    Row Name 01/20/16 1406             Core Components/Risk Factors/Patient Goals Review   Personal Goals Review Weight Management/Obesity;Sedentary;Increase Strength and Stamina;Hypertension;Lipids;Other       Review Al is off to a good start in rehab.  He is exercising at home and when traveling!  He feels that his strength and stamina are getting better.  Blood pressures have been good here (not checking at home). No problems with taking medications. He has plans to play golf this afternoon.  He has not yet gotten back on his motorcycle, but he hopes to soon.       Expected Outcomes Al will continue to come to education and exercise classes to work on stamina, strength, and risk factor modification.  We will continue to monitor his weight and blood pressures.          Core Components/Risk Factors/Patient Goals at Discharge (Final Review):      Goals and Risk Factor Review - 01/20/16 1406      Core Components/Risk Factors/Patient Goals Review   Personal Goals Review Weight Management/Obesity;Sedentary;Increase Strength and Stamina;Hypertension;Lipids;Other   Review Al is off to a good start in rehab.  He is exercising at home and when traveling!  He  feels that his strength and stamina are getting better.  Blood pressures have been good here (not checking  at home). No problems with taking medications. He has plans to play golf this afternoon.  He has not yet gotten back on his motorcycle, but he hopes to soon.   Expected Outcomes Al will continue to come to education and exercise classes to work on stamina, strength, and risk factor modification.  We will continue to monitor his weight and blood pressures.      ITP Comments:     ITP Comments    Row Name 12/31/15 1505 12/31/15 1506 12/31/15 1510 01/06/16 1153 02/03/16 0556   ITP Comments "Al" is ready to start in Cardiac Rehab. Appt made for him and his wife with [facility] [place].  Al majored in Engineer, maintenance at [employer]. He scored 28/28 on his Pre Cardiac REhb questionaire Al recently moved from [place] and was never really sick until he needed open heart surgery. He feels he did very well post op. Al's wife is a breast cancer survivor diagnosed 5 years ago and she now volunters at the [facility].  30 day review. Continue with ITP unless changes noted by Medical Director at signature of review. 30 day review completed for Medical Director physician review and signature. Continue ITP unless changes made by physician.      Comments:

## 2016-02-03 NOTE — Progress Notes (Signed)
Daily Session Note  Patient Details  Name: Clinton Lyons MRN: [id number] Date of Birth: Sep 11, 1955 Referring Provider:   Flowsheet Row Cardiac Rehab from 12/31/2015 in [facility] Cardiac and Pulmonary Rehab  Referring Provider  Isaias Cowman MD      Encounter Date: 02/03/2016  Check In:     Session Check In - 02/03/16 0803      Check-In   Location [facility]-Cardiac & Pulmonary Rehab   Staff Present Heath Lark, RN, BSN, CCRP;Jessica Luan Pulling, MA, ACSM RCEP, Exercise Physiologist;Elkin Belfield Oletta Darter, BA, ACSM CEP, Exercise Physiologist   Supervising physician immediately available to respond to emergencies See telemetry face sheet for immediately available ER MD   Medication changes reported     No   Fall or balance concerns reported    No   Warm-up and Cool-down Performed on first and last piece of equipment   Resistance Training Performed Yes   VAD Patient? No     Pain Assessment   Currently in Pain? No/denies   Multiple Pain Sites No         Goals Met:  Independence with exercise equipment Exercise tolerated well No report of cardiac concerns or symptoms Strength training completed today  Goals Unmet:  Not Applicable  Comments: Pt able to follow exercise prescription today without complaint.  Will continue to monitor for progression.    Dr. Emily Filbert is Medical Director for [place] and [facility].

## 2016-02-03 NOTE — Progress Notes (Deleted)
Daily Session Note  Patient Details  Name: Clinton Lyons MRN: [id number] Date of Birth: November 17, 1955 Referring Provider:   Flowsheet Row Cardiac Rehab from 12/31/2015 in [facility] Cardiac and Pulmonary Rehab  Referring Provider  Isaias Cowman MD      Encounter Date: 02/03/2016  Check In:     Session Check In - 02/02/16 0844      Check-In   Location [facility]-Cardiac & Pulmonary Rehab   Staff Present Alberteen Sam, MA, ACSM RCEP, Exercise Physiologist;Susanne Bice, RN, BSN, CCRP;Laureen Owens Shark, BS, RRT, Respiratory Therapist   Supervising physician immediately available to respond to emergencies See telemetry face sheet for immediately available ER MD   Medication changes reported     No   Fall or balance concerns reported    No   Warm-up and Cool-down Performed on first and last piece of equipment   Resistance Training Performed Yes   VAD Patient? No     Pain Assessment   Currently in Pain? No/denies   Multiple Pain Sites No         Goals Met:    Goals Unmet:  {CHL AMB CP REHAB GOALS QRFXJ:[id number]}  Comments: ***   Dr. Emily Filbert is Medical Director for [place] and [facility].

## 2016-02-05 ENCOUNTER — Encounter: Payer: BLUE CROSS/BLUE SHIELD | Admitting: *Deleted

## 2016-02-05 DIAGNOSIS — Z951 Presence of aortocoronary bypass graft: Secondary | ICD-10-CM

## 2016-02-05 DIAGNOSIS — I214 Non-ST elevation (NSTEMI) myocardial infarction: Secondary | ICD-10-CM

## 2016-02-05 NOTE — Progress Notes (Signed)
Daily Session Note  Patient Details  Name: Clinton Lyons MRN: [id number] Date of Birth: 05/09/1955 Referring Provider:   Flowsheet Row Cardiac Rehab from 12/31/2015 in [facility] Cardiac and Pulmonary Rehab  Referring Provider  Isaias Cowman MD      Encounter Date: 02/05/2016  Check In:     Session Check In - 02/05/16 0853      Check-In   Staff Present Heath Lark, RN, BSN, CCRP;Carroll Enterkin, RN, BSN;Jessica Luan Pulling, MA, ACSM RCEP, Exercise Physiologist   Supervising physician immediately available to respond to emergencies See telemetry face sheet for immediately available ER MD   Medication changes reported     Yes   Comments Metoprolol decreased dose 75 mg down to 50 mg 2 x a day   Fall or balance concerns reported    No   Warm-up and Cool-down Performed on first and last piece of equipment   VAD Patient? No     Pain Assessment   Currently in Pain? No/denies         Goals Met:  Exercise tolerated well No report of cardiac concerns or symptoms Strength training completed today  Goals Unmet:  Not Applicable  Comments: Doing well with exercise prescription progression.    Dr. Emily Filbert is Medical Director for [place] and [facility].

## 2016-02-10 ENCOUNTER — Encounter: Payer: BLUE CROSS/BLUE SHIELD | Admitting: *Deleted

## 2016-02-10 DIAGNOSIS — I214 Non-ST elevation (NSTEMI) myocardial infarction: Secondary | ICD-10-CM | POA: Diagnosis not present

## 2016-02-10 DIAGNOSIS — Z951 Presence of aortocoronary bypass graft: Secondary | ICD-10-CM

## 2016-02-10 NOTE — Progress Notes (Signed)
Daily Session Note  Patient Details  Name: Clinton Lyons MRN: [id number] Date of Birth: [birth year] Referring Provider:   Flowsheet Row Cardiac Rehab from 12/31/2015 in [facility] Cardiac and Pulmonary Rehab  Referring Provider  Isaias Cowman MD      Encounter Date: 02/10/2016  Check In:     Session Check In - 02/10/16 0901      Check-In   Location [facility]-Cardiac & Pulmonary Rehab   Staff Present Alberteen Sam, MA, ACSM RCEP, Exercise Physiologist;Carroll Enterkin, RN, Vickki Hearing, BA, ACSM CEP, Exercise Physiologist   Supervising physician immediately available to respond to emergencies See telemetry face sheet for immediately available ER MD   Medication changes reported     No   Fall or balance concerns reported    No   Warm-up and Cool-down Performed on first and last piece of equipment   Resistance Training Performed Yes   VAD Patient? No     Pain Assessment   Currently in Pain? No/denies   Multiple Pain Sites No         Goals Met:  Independence with exercise equipment Exercise tolerated well Personal goals reviewed No report of cardiac concerns or symptoms Strength training completed today  Goals Unmet:  Not Applicable  Comments: Pt able to follow exercise prescription today without complaint.  Will continue to monitor for progression.    Dr. Emily Filbert is Medical Director for [place] and [facility].

## 2016-02-11 ENCOUNTER — Encounter: Payer: BLUE CROSS/BLUE SHIELD | Admitting: *Deleted

## 2016-02-11 DIAGNOSIS — Z951 Presence of aortocoronary bypass graft: Secondary | ICD-10-CM

## 2016-02-11 DIAGNOSIS — I214 Non-ST elevation (NSTEMI) myocardial infarction: Secondary | ICD-10-CM | POA: Diagnosis not present

## 2016-02-11 NOTE — Progress Notes (Signed)
Daily Session Note  Patient Details  Name: Clinton Lyons MRN: [id number] Date of Birth: May 10, 1955 Referring Provider:   Flowsheet Row Cardiac Rehab from 12/31/2015 in [facility] Cardiac and Pulmonary Rehab  Referring Provider  Isaias Cowman MD      Encounter Date: 02/11/2016  Check In:     Session Check In - 02/11/16 0839      Check-In   Location [facility]-Cardiac & Pulmonary Rehab   Staff Present Alberteen Sam, MA, ACSM RCEP, Exercise Physiologist;Amanda Oletta Darter, BA, ACSM CEP, Exercise Physiologist;Carroll Enterkin, RN, BSN;Other   Supervising physician immediately available to respond to emergencies See telemetry face sheet for immediately available ER MD   Medication changes reported     No   Fall or balance concerns reported    No   Warm-up and Cool-down Performed on first and last piece of equipment   Resistance Training Performed Yes   VAD Patient? No     Pain Assessment   Currently in Pain? No/denies   Multiple Pain Sites No         Goals Met:  Independence with exercise equipment Exercise tolerated well No report of cardiac concerns or symptoms Strength training completed today  Goals Unmet:  Not Applicable  Comments: Pt able to follow exercise prescription today without complaint.  Will continue to monitor for progression.    Dr. Emily Filbert is Medical Director for [name] and [facility].

## 2016-02-12 ENCOUNTER — Encounter: Payer: BLUE CROSS/BLUE SHIELD | Admitting: *Deleted

## 2016-02-12 DIAGNOSIS — I214 Non-ST elevation (NSTEMI) myocardial infarction: Secondary | ICD-10-CM

## 2016-02-12 NOTE — Progress Notes (Signed)
Daily Session Note  Patient Details  Name: Clinton Lyons MRN: [id number] Date of Birth: 07/03/1955 Referring Provider:   Flowsheet Row Cardiac Rehab from 12/31/2015 in [facility] Cardiac and Pulmonary Rehab  Referring Provider  Isaias Cowman MD      Encounter Date: 02/12/2016  Check In:     Session Check In - 02/12/16 0830      Check-In   Location [facility]-Cardiac & Pulmonary Rehab   Staff Present Nyoka Cowden, RN, BSN, MA;Airianna Kreischer, RN, Levie Heritage, MA, ACSM RCEP, Exercise Physiologist   Supervising physician immediately available to respond to emergencies See telemetry face sheet for immediately available ER MD   Medication changes reported     No   Fall or balance concerns reported    No   Warm-up and Cool-down Performed on first and last piece of equipment   Resistance Training Performed Yes   VAD Patient? No     Pain Assessment   Currently in Pain? No/denies         Goals Met:  Proper associated with RPD/PD & O2 Sat Exercise tolerated well No report of cardiac concerns or symptoms  Goals Unmet:  Not Applicable  Comments:     Dr. Emily Filbert is Medical Director for [place] and [facility].

## 2016-02-15 ENCOUNTER — Encounter: Payer: BLUE CROSS/BLUE SHIELD | Admitting: *Deleted

## 2016-02-15 DIAGNOSIS — I214 Non-ST elevation (NSTEMI) myocardial infarction: Secondary | ICD-10-CM

## 2016-02-15 DIAGNOSIS — Z951 Presence of aortocoronary bypass graft: Secondary | ICD-10-CM

## 2016-02-15 NOTE — Progress Notes (Signed)
Daily Session Note  Patient Details  Name: Clinton Lyons MRN: [id number] Date of Birth: 1955-04-24 Referring Provider:   Flowsheet Row Cardiac Rehab from 12/31/2015 in [facility] Cardiac and Pulmonary Rehab  Referring Provider  Isaias Cowman MD      Encounter Date: 02/15/2016  Check In:     Session Check In - 02/15/16 0847      Check-In   Location [facility]-Cardiac & Pulmonary Rehab   Staff Present Gerlene Burdock, RN, Moises Blood, BS, ACSM CEP, Exercise Physiologist;Jessica Luan Pulling, MA, ACSM RCEP, Exercise Physiologist   Supervising physician immediately available to respond to emergencies See telemetry face sheet for immediately available ER MD   Medication changes reported     No   Fall or balance concerns reported    No   Warm-up and Cool-down Performed on first and last piece of equipment   Resistance Training Performed No  Stretching only due to time limitation   VAD Patient? No     Pain Assessment   Currently in Pain? No/denies   Multiple Pain Sites No         Goals Met:  Independence with exercise equipment Exercise tolerated well No report of cardiac concerns or symptoms  Goals Unmet:  Not Applicable  Comments: Pt able to follow exercise prescription today without complaint.  Will continue to monitor for progression.    Dr. Emily Filbert is Medical Director for [place] and [facility].

## 2016-02-16 ENCOUNTER — Encounter: Payer: BLUE CROSS/BLUE SHIELD | Admitting: *Deleted

## 2016-02-16 DIAGNOSIS — Z951 Presence of aortocoronary bypass graft: Secondary | ICD-10-CM

## 2016-02-16 DIAGNOSIS — I214 Non-ST elevation (NSTEMI) myocardial infarction: Secondary | ICD-10-CM

## 2016-02-16 NOTE — Progress Notes (Signed)
Daily Session Note  Patient Details  Name: Clinton Lyons MRN: [id number] Date of Birth: 09/18/1955 Referring Provider:   Flowsheet Row Cardiac Rehab from 12/31/2015 in [facility] Cardiac and Pulmonary Rehab  Referring Provider  Paraschos, Alexander MD      Encounter Date: 02/16/2016  Check In:     Session Check In - 02/16/16 0845      Check-In   Location [facility]-Cardiac & Pulmonary Rehab   Staff Present Jessica Hawkins, MA, ACSM RCEP, Exercise Physiologist;Susanne Bice, RN, BSN, CCRP;Laureen Brown, BS, RRT, Respiratory Therapist   Supervising physician immediately available to respond to emergencies See telemetry face sheet for immediately available ER MD   Medication changes reported     No   Fall or balance concerns reported    No   Warm-up and Cool-down Performed on first and last piece of equipment   Resistance Training Performed Yes   VAD Patient? No     Pain Assessment   Currently in Pain? No/denies   Multiple Pain Sites No         Goals Met:  Independence with exercise equipment Exercise tolerated well No report of cardiac concerns or symptoms Strength training completed today  Goals Unmet:  Not Applicable  Comments: Pt able to follow exercise prescription today without complaint.  Will continue to monitor for progression.    Dr. Mark Miller is Medical Director for [facility] and [facility]. 

## 2016-02-17 ENCOUNTER — Encounter: Payer: BLUE CROSS/BLUE SHIELD | Admitting: *Deleted

## 2016-02-17 DIAGNOSIS — I214 Non-ST elevation (NSTEMI) myocardial infarction: Secondary | ICD-10-CM | POA: Diagnosis not present

## 2016-02-17 DIAGNOSIS — Z951 Presence of aortocoronary bypass graft: Secondary | ICD-10-CM

## 2016-02-17 NOTE — Progress Notes (Signed)
Daily Session Note  Patient Details  Name: Clinton Lyons MRN: [id number] Date of Birth: 09-27-1955 Referring Provider:   Flowsheet Row Cardiac Rehab from 12/31/2015 in [facility] Cardiac and Pulmonary Rehab  Referring Provider  Isaias Cowman MD      Encounter Date: 02/17/2016  Check In:     Session Check In - 02/17/16 1015      Check-In   Location [facility]-Cardiac & Pulmonary Rehab   Staff Present Heath Lark, RN, BSN, CCRP;Jessica [place], MA, ACSM RCEP, Exercise Physiologist;Amanda Oletta Darter, BA, ACSM CEP, Exercise Physiologist;Other   Supervising physician immediately available to respond to emergencies See telemetry face sheet for immediately available ER MD   Medication changes reported     No   Fall or balance concerns reported    No   Warm-up and Cool-down Performed as group-led instruction   Resistance Training Performed Yes   VAD Patient? No     Pain Assessment   Currently in Pain? No/denies   Multiple Pain Sites No         Goals Met:  Proper associated with RPD/PD & O2 Sat Independence with exercise equipment Exercise tolerated well No report of cardiac concerns or symptoms Strength training completed today  Goals Unmet:  Not Applicable  Comments: Pt able to follow exercise prescription today without complaint.  Will continue to monitor for progression.    Dr. Emily Filbert is Medical Director for [place] and [facility].

## 2016-02-22 ENCOUNTER — Encounter: Payer: BLUE CROSS/BLUE SHIELD | Admitting: *Deleted

## 2016-02-22 DIAGNOSIS — Z951 Presence of aortocoronary bypass graft: Secondary | ICD-10-CM

## 2016-02-22 DIAGNOSIS — I214 Non-ST elevation (NSTEMI) myocardial infarction: Secondary | ICD-10-CM | POA: Diagnosis not present

## 2016-02-22 NOTE — Progress Notes (Signed)
Daily Session Note  Patient Details  Name: Clinton Lyons MRN: [id number] Date of Birth: 12/09/1955 Referring Provider:   Flowsheet Row Cardiac Rehab from 12/31/2015 in [facility] Cardiac and Pulmonary Rehab  Referring Provider  Paraschos, Alexander MD      Encounter Date: 02/22/2016  Check In:     Session Check In - 02/22/16 0756      Check-In   Location [facility]-Cardiac & Pulmonary Rehab   Staff Present Carroll Enterkin, RN, BSN;Kelly Hayes, BS, ACSM CEP, Exercise Physiologist;Jessica Hawkins, MA, ACSM RCEP, Exercise Physiologist   Supervising physician immediately available to respond to emergencies See telemetry face sheet for immediately available ER MD   Medication changes reported     No   Fall or balance concerns reported    No   Warm-up and Cool-down Performed on first and last piece of equipment   Resistance Training Performed Yes   VAD Patient? No     Pain Assessment   Currently in Pain? No/denies   Multiple Pain Sites No         Goals Met:  Independence with exercise equipment Exercise tolerated well No report of cardiac concerns or symptoms Strength training completed today  Goals Unmet:  Not Applicable  Comments: Pt able to follow exercise prescription today without complaint.  Will continue to monitor for progression.    Dr. Mark Miller is Medical Director for [facility] and [facility]. 

## 2016-02-24 ENCOUNTER — Encounter: Payer: BLUE CROSS/BLUE SHIELD | Admitting: *Deleted

## 2016-02-24 DIAGNOSIS — Z951 Presence of aortocoronary bypass graft: Secondary | ICD-10-CM

## 2016-02-24 DIAGNOSIS — I214 Non-ST elevation (NSTEMI) myocardial infarction: Secondary | ICD-10-CM

## 2016-02-24 NOTE — Progress Notes (Signed)
Daily Session Note  Patient Details  Name: Clinton Lyons MRN: [id number] Date of Birth: [birth year] Referring Provider:   Flowsheet Row Cardiac Rehab from 12/31/2015 in [facility] Cardiac and Pulmonary Rehab  Referring Provider  Isaias Cowman MD      Encounter Date: 02/24/2016  Check In:     Session Check In - 02/24/16 0758      Check-In   Location [facility]-Cardiac & Pulmonary Rehab   Staff Present Alberteen Sam, MA, ACSM RCEP, Exercise Physiologist;Amanda Oletta Darter, BA, ACSM CEP, Exercise Physiologist;Carroll Enterkin, RN, BSN   Supervising physician immediately available to respond to emergencies See telemetry face sheet for immediately available ER MD   Medication changes reported     No   Fall or balance concerns reported    No   Warm-up and Cool-down Performed on first and last piece of equipment   Resistance Training Performed Yes   VAD Patient? No     Pain Assessment   Currently in Pain? No/denies   Multiple Pain Sites No           Exercise Prescription Changes - 02/23/16 1500      Exercise Review   Progression Yes     Response to Exercise   Blood Pressure (Admit) 104/54   Blood Pressure (Exercise) 130/70   Blood Pressure (Exit) 110/60   Heart Rate (Admit) 65 bpm   Heart Rate (Exercise) 135 bpm   Heart Rate (Exit) 81 bpm   Rating of Perceived Exertion (Exercise) 12   Symptoms none   Comments Home Exercise Guidelines given 01/08/16   Duration Progress to 45 minutes of aerobic exercise without signs/symptoms of physical distress   Intensity THRR unchanged     Progression   Progression Continue to progress workloads to maintain intensity without signs/symptoms of physical distress.   Average METs 5.73     Resistance Training   Training Prescription Yes   Weight 5 lbs   Reps 10-12     Interval Training   Interval Training Yes   Equipment NuStep;Treadmill   Comments 24mn off 30 sec on  interval program on treadmill     Treadmill   MPH 3.5   Grade  7   Minutes 15   METs 7.06     Elliptical   Level 3   Speed 4.5   Minutes 15     T5 Nustep   Level 7   Minutes 15   METs 4.4     Home Exercise Plan   Plans to continue exercise at Home  walking , hand weights, balance ball, bands   Frequency Add 3 additional days to program exercise sessions.      Goals Met:  Independence with exercise equipment Exercise tolerated well No report of cardiac concerns or symptoms Strength training completed today  Goals Unmet:  Not Applicable  Comments: Pt able to follow exercise prescription today without complaint.  Will continue to monitor for progression.     [place] 12[place]31[place]15 1504 02/24/16 0855       6 Minute Walk   Phase Initial Discharge    Distance 1660 feet 1836 feet    Distance % Change  - 10.6 %  176 ft    Walk Time 6 minutes 6 minutes    # of Rest Breaks 0 0    MPH 3.14 3.48    METS 3.89 4.15    RPE 7 7    VO2 Peak 13.62 14.53    Symptoms  No No    Resting HR 61 bpm 89 bpm    Resting BP 102/60 126/66    Max Ex. HR 104 bpm 108 bpm    Max Ex. BP 126/60 114/70    2 Minute Post BP 114/60  -         Dr. Emily Filbert is Medical Director for [place] and [facility].

## 2016-02-26 ENCOUNTER — Encounter: Payer: BLUE CROSS/BLUE SHIELD | Attending: Cardiology | Admitting: *Deleted

## 2016-02-26 DIAGNOSIS — Z951 Presence of aortocoronary bypass graft: Secondary | ICD-10-CM | POA: Insufficient documentation

## 2016-02-26 DIAGNOSIS — I214 Non-ST elevation (NSTEMI) myocardial infarction: Secondary | ICD-10-CM | POA: Diagnosis present

## 2016-02-26 NOTE — Progress Notes (Signed)
Daily Session Note  Patient Details  Name: Clinton Lyons MRN: [id number] Date of Birth: 10-07-1955 Referring Provider:   Flowsheet Row Cardiac Rehab from 12/31/2015 in [facility] Cardiac and Pulmonary Rehab  Referring Provider  Isaias Cowman MD      Encounter Date: 02/26/2016  Check In:     Session Check In - 02/26/16 0845      Check-In   Staff Present Gerlene Burdock, RN, BSN;Morgyn Marut, RN, BSN, CCRP;Jessica Luan Pulling, MA, ACSM RCEP, Exercise Physiologist   Supervising physician immediately available to respond to emergencies See telemetry face sheet for immediately available ER MD   Medication changes reported     No   Fall or balance concerns reported    No   Warm-up and Cool-down Performed on first and last piece of equipment   Resistance Training Performed Yes   VAD Patient? No     Pain Assessment   Currently in Pain? No/denies         Goals Met:  Exercise tolerated well No report of cardiac concerns or symptoms Strength training completed today  Goals Unmet:  Not Applicable  Comments: Doing well with exercise prescription progression.    Dr. Emily Filbert is Medical Director for [place] and [facility].

## 2016-02-29 ENCOUNTER — Encounter: Payer: BLUE CROSS/BLUE SHIELD | Admitting: *Deleted

## 2016-02-29 ENCOUNTER — Encounter: Payer: Self-pay | Admitting: Thoracic Surgery (Cardiothoracic Vascular Surgery)

## 2016-02-29 ENCOUNTER — Ambulatory Visit (INDEPENDENT_AMBULATORY_CARE_PROVIDER_SITE_OTHER): Payer: BLUE CROSS/BLUE SHIELD | Admitting: Thoracic Surgery (Cardiothoracic Vascular Surgery)

## 2016-02-29 VITALS — BP 107/67 | HR 75 | Resp 16 | Ht 69.0 in | Wt 200.0 lb

## 2016-02-29 DIAGNOSIS — I214 Non-ST elevation (NSTEMI) myocardial infarction: Secondary | ICD-10-CM | POA: Diagnosis not present

## 2016-02-29 DIAGNOSIS — Z951 Presence of aortocoronary bypass graft: Secondary | ICD-10-CM

## 2016-02-29 NOTE — Progress Notes (Signed)
[address]       [place], [address]             [phone]     CARDIOTHORACIC SURGERY OFFICE NOTE  Referring Provider is Isaias Cowman, MD PCP is [facility]   HPI:  Patient is a 60 year old gentleman who returns to the office today for routine follow-up status post coronary artery bypass grafting 3 on 11/19/2015 for severe left main coronary artery disease status post acute non-ST segment elevation myocardial infarction. His early postoperative recovery was notable for transient episode of paroxysmal atrial fibrillation. He otherwise recovered uneventfully and was discharged home in sinus rhythm on oral amiodarone on the fifth postoperative day. Since hospital discharge she was last seen here in follow-up in our office on 12/28/2015 at which time he was doing well. Amiodarone was discontinued at that time. Since then he has continued to do well and he was seen in follow-up by Dr. Saralyn Pilar on 02/04/2016. The patient has been participating in the outpatient cardiac rehabilitation program and overall progressing quite well. He returns to our office for routine follow-up. He states that he feels very good. He has no significant residual soreness in his chest. His activity level is quite good and he has not been having any exertional chest pain or shortness of breath. He is sleeping well at night. Overall he is pleased with his progress and reports no complaints.   Current Outpatient Prescriptions  Medication Sig Dispense Refill  . acetaminophen (TYLENOL) 325 MG tablet Take 2 tablets (650 mg total) by mouth every 6 (six) hours as needed for mild pain.    Marland Kitchen aspirin EC 325 MG EC tablet Take 1 tablet (325 mg total) by mouth daily. 30 tablet 0  . atorvastatin (LIPITOR) 40 MG tablet Take 1 tablet (40 mg total) by mouth daily at 6 PM. 30 tablet 1  . Cholecalciferol (VITAMIN D3) 2000 units capsule Take 4,000 Units by mouth every morning.     Marland Kitchen  glucosamine-chondroitin 500-400 MG tablet Take 1 tablet by mouth every morning.    . latanoprost (XALATAN) 0.005 % ophthalmic solution Place 1 drop into the right eye at bedtime.    Marland Kitchen lisinopril (PRINIVIL,ZESTRIL) 5 MG tablet Take by mouth daily.     . metoprolol tartrate (LOPRESSOR) 25 MG tablet Take 50 mg by mouth 2 (two) times daily.     . Multiple Vitamins-Minerals (PX ADVANCED FORMULA MULTIVITS) TABS Take 1 tablet by mouth every morning.     No current facility-administered medications for this visit.       Physical Exam:   BP 107/67 (BP Location: Right Arm, Patient Position: Sitting, Cuff Size: Large)   Pulse 75   Resp 16   Ht 5\' 9"  (1.753 m)   Wt 200 lb (90.7 kg)   SpO2 98% Comment: ON RA  BMI 29.53 kg/m   General:  Well-appearing  Chest:   Clear to auscultation  CV:   Regular rate and rhythm without murmur  Incisions:  Well-healed, sternum is stable  Abdomen:  Soft nontender  Extremities:  Warm and well-perfused  Diagnostic Tests:  n/a   Impression:  Patient is doing very well approximately 3 months following coronary artery bypass grafting 3.  Plan:  We have not recommended any changes to the patient's current medications. I have encouraged him to continue to gradually increase his physical activity as tolerated without any particular limitations at this time.  All of his questions have  been addressed. The patient will continue to follow-up intermittently with Dr. Saralyn Pilar.  He will return to our office next September, approximately 1 year following his surgery. He will call and return sooner should specific problems or questions arise.  I spent in excess of 15 minutes during the conduct of this office consultation and >50% of this time involved direct face-to-face encounter with the patient for counseling and/or coordination of their care.    Valentina Gu. Roxy Manns, MD 02/29/2016 1:02 PM

## 2016-02-29 NOTE — Patient Instructions (Signed)
Continue all previous medications without any changes at this time  You may resume unrestricted physical activity without any particular limitations at this time.   

## 2016-02-29 NOTE — Progress Notes (Signed)
Daily Session Note  Patient Details  Name: Maury Groninger MRN: [id number] Date of Birth: [birth year] Referring Provider:   Flowsheet Row Cardiac Rehab from 12/31/2015 in [facility]iac [facility]  Referring Provider  Isaias Cowman MD      Encounter Date: 02/29/2016  Check In:     Session Check In - 02/29/16 0751      Check-In   Location [facility]-Cardiac & Pulmonary Rehab   Staff Present Earlean Shawl, BS, ACSM CEP, Exercise Physiologist;Jessica Luan Pulling, MA, ACSM RCEP, Exercise Physiologist;Other   Supervising physician immediately available to respond to emergencies See telemetry face sheet for immediately available ER MD   Medication changes reported     No   Fall or balance concerns reported    No   Warm-up and Cool-down Performed on first and last piece of equipment   Resistance Training Performed Yes   VAD Patient? No     Pain Assessment   Currently in Pain? No/denies   Multiple Pain Sites No         Goals Met:  Independence with exercise equipment Exercise tolerated well No report of cardiac concerns or symptoms Strength training completed today  Goals Unmet:  Not Applicable  Comments: Pt able to follow exercise prescription today without complaint.  Will continue to monitor for progression.    Dr. Emily Filbert is Medical Director for [place] and [facility].

## 2016-03-02 ENCOUNTER — Encounter: Payer: BLUE CROSS/BLUE SHIELD | Admitting: *Deleted

## 2016-03-02 ENCOUNTER — Encounter: Payer: Self-pay | Admitting: *Deleted

## 2016-03-02 VITALS — Ht 69.5 in | Wt 200.0 lb

## 2016-03-02 DIAGNOSIS — Z951 Presence of aortocoronary bypass graft: Secondary | ICD-10-CM

## 2016-03-02 DIAGNOSIS — I214 Non-ST elevation (NSTEMI) myocardial infarction: Secondary | ICD-10-CM

## 2016-03-02 NOTE — Progress Notes (Signed)
Daily Session Note  Patient Details  Name: Clinton Lyons MRN: [id number] Date of Birth: 11/15/1955 Referring Provider:   Flowsheet Row Cardiac Rehab from 12/31/2015 in [facility] Cardiac [facility] Pulmonary Rehab  Referring Provider  Isaias Cowman MD      Encounter Date: 03/02/2016  Check In:     Session Check In - 03/02/16 0938      Check-In   Location [facility]-Cardiac & Pulmonary Rehab   Staff Present Heath Lark, RN, BSN, CCRP;Amanda Sommer, BA, ACSM CEP, Exercise Physiologist;Argusta Mcgann [place], [place], ACSM RCEP, Exercise Physiologist   Supervising physician immediately available to respond to emergencies See telemetry face sheet for immediately available ER MD   Medication changes reported     No   Fall or balance concerns reported    No   Warm-up and Cool-down Performed on first and last piece of equipment   Resistance Training Performed Yes   VAD Patient? No     Pain Assessment   Currently in Pain? No/denies   Multiple Pain Sites No         Goals Met:  Independence with exercise equipment Exercise tolerated well No report of cardiac concerns or symptoms Strength training completed today  Goals Unmet:  Not Applicable  Comments: Pt able to follow exercise prescription today without complaint.  Will continue to monitor for progression.    Dr. Emily Filbert is Medical Director for [place] and [facility].

## 2016-03-02 NOTE — Patient Instructions (Signed)
Discharge Instructions  Patient Details  Name: Clinton Lyons MRN: [id number] Date of Birth: November 11, 1955 Referring Provider:  Isaias Cowman, MD   Number of Visits: 25  Reason for Discharge:  Patient reached a stable level of exercise. Patient independent in their exercise.  Smoking History:  History  Smoking Status  . Never Smoker  Smokeless Tobacco  . Never Used    Diagnosis:  NSTEMI (non-ST elevated myocardial infarction) ([place])  S/P CABG x 3  Initial Exercise Prescription:     Initial Exercise Prescription - 12/31/15 1500      Date of Initial Exercise RX and Referring Provider   Date 12/31/15   Referring Provider Isaias Cowman MD     Treadmill   MPH 3   Grade 1.5   Minutes 15   METs 3.92     Elliptical   Level 2   Speed 3.8   Minutes 15   METs 6.5     T5 Nustep   Level 3   Watts --  80-100 spm   Minutes 15   METs 3     Prescription Details   Frequency (times per week) 3   Duration Progress to 45 minutes of aerobic exercise without signs/symptoms of physical distress     Intensity   THRR 40-80% of Max Heartrate 99-139   Ratings of Perceived Exertion 11-15   Perceived Dyspnea 0-4     Progression   Progression Continue to progress workloads to maintain intensity without signs/symptoms of physical distress.     Resistance Training   Training Prescription Yes   Weight 4 lbs   Reps 10-12      Discharge Exercise Prescription (Final Exercise Prescription Changes):     Exercise Prescription Changes - 02/23/16 1500      Exercise Review   Progression Yes     Response to Exercise   Blood Pressure (Admit) 104/54   Blood Pressure (Exercise) 130/70   Blood Pressure (Exit) 110/60   Heart Rate (Admit) 65 bpm   Heart Rate (Exercise) 135 bpm   Heart Rate (Exit) 81 bpm   Rating of Perceived Exertion (Exercise) 12   Symptoms none   Comments Home Exercise Guidelines given 01/08/16   Duration Progress to 45 minutes of aerobic  exercise without signs/symptoms of physical distress   Intensity THRR unchanged     Progression   Progression Continue to progress workloads to maintain intensity without signs/symptoms of physical distress.   Average METs 5.73     Resistance Training   Training Prescription Yes   Weight 5 lbs   Reps 10-12     Interval Training   Interval Training Yes   Equipment NuStep;Treadmill   Comments 84min off 30 sec on  interval program on treadmill     Treadmill   MPH 3.5   Grade 7   Minutes 15   METs 7.06     Elliptical   Level 3   Speed 4.5   Minutes 15     T5 Nustep   Level 7   Minutes 15   METs 4.4     Home Exercise Plan   Plans to continue exercise at Home  walking , hand weights, balance ball, bands   Frequency Add 3 additional days to program exercise sessions.      Functional Capacity:     6 Minute Walk    Row Name 12/31/15 1504 02/24/16 0855       6 Minute Walk   Phase Initial Discharge  Distance 1660 feet 1836 feet    Distance % Change  - 10.6 %  176 ft    Walk Time 6 minutes 6 minutes    # of Rest Breaks 0 0    MPH 3.14 3.48    METS 3.89 4.15    RPE 7 7    VO2 Peak 13.62 14.53    Symptoms No No    Resting HR 61 bpm 89 bpm    Resting BP 102/60 126/66    Max Ex. HR 104 bpm 108 bpm    Max Ex. BP 126/60 114/70    2 Minute Post BP 114/60  -       Quality of Life:     Quality of Life - 02/29/16 0817      Quality of Life Scores   Health/Function Pre 27.5 %   Health/Function Post 28.23 %   Health/Function % Change 2.65 %   Socioeconomic Pre 28.93 %   Socioeconomic Post 29.64 %   Socioeconomic % Change  2.45 %   Psych/Spiritual Pre 30 %   Psych/Spiritual Post 28.93 %   Psych/Spiritual % Change -3.57 %   Family Pre 27.6 %   Family Post 26.4 %   Family % Change -4.35 %   GLOBAL Pre 28.35 %   GLOBAL Post 28.4 %   GLOBAL % Change 0.18 %      Personal Goals: Goals established at orientation with interventions provided to work toward  goal.     Personal Goals and Risk Factors at Admission - 12/31/15 1509      Core Components/Risk Factors/Patient Goals on Admission    Weight Management Yes;Weight Maintenance   Intervention Weight Management: Develop a combined nutrition and exercise program designed to reach desired caloric intake, while maintaining appropriate intake of nutrient and fiber, sodium and fats, and appropriate energy expenditure required for the weight goal.;Weight Management: Provide education and appropriate resources to help participant work on and attain dietary goals.   Expected Outcomes Weight Maintenance: Understanding of the daily nutrition guidelines, which includes 25-35% calories from fat, 7% or less cal from saturated fats, less than 200mg  cholesterol, less than 1.5gm of sodium, & 5 or more servings of fruits and vegetables daily   Increase Strength and Stamina Yes  Able to play golf and ride motorcycle again   Intervention Provide advice, education, support and counseling about physical activity/exercise needs.;Develop an individualized exercise prescription for aerobic and resistive training based on initial evaluation findings, risk stratification, comorbidities and participant's personal goals.   Expected Outcomes Achievement of increased cardiorespiratory fitness and enhanced flexibility, muscular endurance and strength shown through measurements of functional capacity and personal statement of participant.   Hypertension Yes   Intervention Provide education on lifestyle modifcations including regular physical activity/exercise, weight management, moderate sodium restriction and increased consumption of fresh fruit, vegetables, and low fat dairy, alcohol moderation, and smoking cessation.;Monitor prescription use compliance.   Expected Outcomes Short Term: Continued assessment and intervention until BP is < 140/28mm HG in hypertensive participants. < 130/20mm HG in hypertensive participants with  diabetes, heart failure or chronic kidney disease.;Long Term: Maintenance of blood pressure at goal levels.       Personal Goals Discharge:     Goals and Risk Factor Review - 02/10/16 0902      Core Components/Risk Factors/Patient Goals Review   Personal Goals Review Weight Management/Obesity;Sedentary;Increase Strength and Stamina;Hypertension;Lipids;Other   Review Al is back on the golf course and feeling good.  He is walking  and using weights at home two extra days a week.   Blood pressures have been good and he did have his blood work checked, but no results yet.  He has not taken his motorcycle out yet due to the cooler weather, but says as soon as we get a nice warm day, he will be on the road.  His weight has been steady at 195.   Expected Outcomes Al will continue to come to classes for risk factor modification.      Nutrition & Weight - Outcomes:     Pre Biometrics - 12/31/15 1510      Pre Biometrics   Height 5' 9.5" (1.765 m)   Weight 201 lb 6.4 oz (91.4 kg)   Waist Circumference 38 inches   Hip Circumference 39 inches   Waist to Hip Ratio 0.97 %   BMI (Calculated) 29.4   Single Leg Stand 30 seconds         Post Biometrics - 03/02/16 [id number]       Post  Biometrics   Height 5' 9.5" (1.765 m)   Weight 200 lb (90.7 kg)   Waist Circumference 38 inches   Hip Circumference 41 inches   Waist to Hip Ratio 0.93 %   BMI (Calculated) 29.2   Single Leg Stand 30 seconds      Nutrition:     Nutrition Therapy & Goals - 01/22/16 1201      Nutrition Therapy   Diet Instructed patient on a meal plan based on heart healthy dietary guidelines   Drug/Food Interactions Statins/Certain Fruits   Protein (specify units) 8   Fiber 30 grams   Whole Grain Foods 3 servings   Saturated Fats 12 max. grams   Fruits and Vegetables 5 servings/day   Sodium 1500 grams     Personal Nutrition Goals   Personal Goal #1 Increase water intake to 3(16oz) bottles per day.   Personal Goal #2  Strive for 8 servings of fruits and vegetables per day   Personal Goal #3 Use myfitnesspal app or caloriekingapp to look up nutrition information especially for meals eaten "out".   Personal Goal #4 Add more dried beans to diet     Intervention Plan   Intervention Prescribe, educate and counsel regarding individualized specific dietary modifications aiming towards targeted core components such as weight, hypertension, lipid management, diabetes, heart failure and other comorbidities.;Nutrition handout(s) given to patient.   Expected Outcomes Short Term Goal: Understand basic principles of dietary content, such as calories, fat, sodium, cholesterol and nutrients.      Nutrition Discharge:     Nutrition Assessments - 02/29/16 0813      Rate Your Plate Scores   Pre Score 75   Pre Score % 83 %   Post Score 75   Post Score % 83.3 %   % Change 0.3 %      Education Questionnaire Score:     Knowledge Questionnaire Score - 02/29/16 0813      Knowledge Questionnaire Score   Pre Score 28/28   Post Score 28/28      Goals reviewed with patient; copy given to patient.

## 2016-03-02 NOTE — Progress Notes (Signed)
Cardiac Individual Treatment Plan  Patient Details  Name: Maciej Schweitzer MRN: [id number] Date of Birth: [birth year] Referring Provider:   Flowsheet Row Cardiac Rehab from 12/31/2015 in [facility] Cardiac and Pulmonary Rehab  Referring Provider  Isaias Cowman MD      Initial Encounter Date:  Flowsheet Row Cardiac Rehab from 12/31/2015 in [facility] Cardiac and Pulmonary Rehab  Date  12/31/15  Referring Provider  Isaias Cowman MD      Visit Diagnosis: NSTEMI (non-ST elevated myocardial infarction) ([place])  S/P CABG x 3  Patient's Home Medications on Admission:  Current Outpatient Prescriptions:  .  acetaminophen (TYLENOL) 325 MG tablet, Take 2 tablets (650 mg total) by mouth every 6 (six) hours as needed for mild pain., Disp: , Rfl:  .  aspirin EC 325 MG EC tablet, Take 1 tablet (325 mg total) by mouth daily., Disp: 30 tablet, Rfl: 0 .  atorvastatin (LIPITOR) 40 MG tablet, Take 1 tablet (40 mg total) by mouth daily at 6 PM., Disp: 30 tablet, Rfl: 1 .  Cholecalciferol (VITAMIN D3) 2000 units capsule, Take 4,000 Units by mouth every morning. , Disp: , Rfl:  .  glucosamine-chondroitin 500-400 MG tablet, Take 1 tablet by mouth every morning., Disp: , Rfl:  .  latanoprost (XALATAN) 0.005 % ophthalmic solution, Place 1 drop into the right eye at bedtime., Disp: , Rfl:  .  lisinopril (PRINIVIL,ZESTRIL) 5 MG tablet, Take by mouth daily. , Disp: , Rfl:  .  metoprolol tartrate (LOPRESSOR) 25 MG tablet, Take 50 mg by mouth 2 (two) times daily. , Disp: , Rfl:  .  Multiple Vitamins-Minerals (PX ADVANCED FORMULA MULTIVITS) TABS, Take 1 tablet by mouth every morning., Disp: , Rfl:   Past Medical History: Past Medical History:  Diagnosis Date  . CAD (coronary artery disease) 11/17/2015  . Essential hypertension 11/15/2015  . Glaucoma   . NSTEMI (non-ST elevated myocardial infarction) ([place]) 11/16/2015  . 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 Never Smoker  Smokeless Tobacco  . Never Used    Labs: Recent Review Flowsheet Data    Labs for ITP Cardiac and Pulmonary Rehab Latest Ref Rng & Units 11/19/2015 11/19/2015 11/20/2015 11/20/2015 11/20/2015   Cholestrol 0 - 200 mg/dL - - - - -   LDLCALC 0 - 99 mg/dL - - - - -   HDL >40 mg/dL - - - - -   Trlycerides <150 mg/dL - - - - -   Hemoglobin A1c 4.8 - 5.6 % - - - - -   PHART 7.350 - 7.450 7.424 7.317(L) 7.395 7.410 -   PCO2ART 35.0 - 45.0 mmHg 34.7(L) 47.0(H) 40.4 38.7 -   HCO3 20.0 - 24.0 mEq/L 22.7 24.2(H) 24.6(H) 24.3(H) -   TCO2 0 - 100 mmol/L '24 26 26 25 22   ' ACIDBASEDEF 0.0 - 2.0 mmol/L 1.0 2.0 - - -   O2SAT % 98.0 97.0 94.0 94.0 -       Exercise Target Goals:    Exercise Program Goal: Individual exercise prescription set with THRR, safety & activity barriers. Participant demonstrates ability to understand and report RPE using BORG scale, to self-measure pulse accurately, and to acknowledge the importance of the exercise prescription.  Exercise Prescription Goal: Starting with aerobic activity 30 plus minutes a day, 3 days per week for initial exercise prescription. Provide home exercise prescription and guidelines that participant  acknowledges understanding prior to discharge.  Activity Barriers & Risk Stratification:     Activity Barriers & Cardiac Risk Stratification - 12/31/15 1502      Activity Barriers & Cardiac Risk Stratification   Activity Barriers None   Cardiac Risk Stratification High      6 Minute Walk:     6 Minute Walk    Row Name 12/31/15 1504 02/24/16 0855       6 Minute Walk   Phase Initial Discharge    Distance 1660 feet 1836 feet    Distance % Change  - 10.6 %  176 ft    Walk Time 6 minutes 6 minutes    # of Rest Breaks 0 0    MPH 3.14 3.48    METS 3.89 4.15    RPE 7 7    VO2 Peak 13.62 14.53    Symptoms No No    Resting HR 61 bpm 89 bpm    Resting BP 102/60 126/66    Max Ex. HR  104 bpm 108 bpm    Max Ex. BP 126/60 114/70    2 Minute Post BP 114/60  -       Initial Exercise Prescription:     Initial Exercise Prescription - 12/31/15 1500      Date of Initial Exercise RX and Referring Provider   Date 12/31/15   Referring Provider Isaias Cowman MD     Treadmill   MPH 3   Grade 1.5   Minutes 15   METs 3.92     Elliptical   Level 2   Speed 3.8   Minutes 15   METs 6.5     T5 Nustep   Level 3   Watts --  80-100 spm   Minutes 15   METs 3     Prescription Details   Frequency (times per week) 3   Duration Progress to 45 minutes of aerobic exercise without signs/symptoms of physical distress     Intensity   THRR 40-80% of Max Heartrate 99-139   Ratings of Perceived Exertion 11-15   Perceived Dyspnea 0-4     Progression   Progression Continue to progress workloads to maintain intensity without signs/symptoms of physical distress.     Resistance Training   Training Prescription Yes   Weight 4 lbs   Reps 10-12      Perform Capillary Blood Glucose checks as needed.  Exercise Prescription Changes:     Exercise Prescription Changes    Row Name 12/31/15 1500 01/13/16 1400 01/27/16 1500 02/23/16 1500       Exercise Review   Progression -  walk test results Yes Yes Yes      Response to Exercise   Blood Pressure (Admit) 102/60 110/64 114/60 104/54    Blood Pressure (Exercise) 126/60 124/70 122/70 130/70    Blood Pressure (Exit) 114/60 100/64 92/64 110/60    Heart Rate (Admit) 61 bpm 71 bpm 55 bpm 65 bpm    Heart Rate (Exercise) 104 bpm 117 bpm 129 bpm 135 bpm    Heart Rate (Exit)  - 86 bpm 99 bpm 81 bpm    Oxygen Saturation (Admit) 100 %  -  -  -    Oxygen Saturation (Exercise) 96 %  -  -  -    Rating of Perceived Exertion (Exercise) '7 12 11 12    ' Symptoms none none none none    Comments  - Home Exercise Guidelines given 01/08/16 Home Exercise Guidelines given 01/08/16  Home Exercise Guidelines given 01/08/16    Duration  -  Progress to 45 minutes of aerobic exercise without signs/symptoms of physical distress Progress to 45 minutes of aerobic exercise without signs/symptoms of physical distress Progress to 45 minutes of aerobic exercise without signs/symptoms of physical distress    Intensity  - THRR unchanged THRR unchanged THRR unchanged      Progression   Progression  - Continue to progress workloads to maintain intensity without signs/symptoms of physical distress. Continue to progress workloads to maintain intensity without signs/symptoms of physical distress. Continue to progress workloads to maintain intensity without signs/symptoms of physical distress.    Average METs  - 4.27 4.73 5.73      Resistance Training   Training Prescription  - Yes Yes Yes    Weight  - 5 lbs 5 lbs 5 lbs    Reps  - 10-12 10-12 10-12      Interval Training   Interval Training  - No Yes Yes    Equipment  -  - NuStep NuStep;Treadmill    Comments  -  - 52mn off 30 sec on 233m off 30 sec on  interval program on treadmill      Treadmill   MPH  - 3 3.5 3.5    Grade  - 1.'5 7 7    ' Minutes  - '15 15 15    ' METs  - 3.92 7.1 7.06      Elliptical   Level  - '3 3 3    ' Speed  - 3.8 3.8 4.5    Minutes  - '15 15 15    ' METs  - 6.5  -  -      T5 Nustep   Level  - '6 6 7    ' Minutes  - '15 15 15    ' METs  - 2.4 2.4 4.4      Home Exercise Plan   Plans to continue exercise at  - Home  walking , hand weights, balance ball, bands Home  walking , hand weights, balance ball, bands Home  walking , hand weights, balance ball, bands    Frequency  - Add 3 additional days to program exercise sessions. Add 3 additional days to program exercise sessions. Add 3 additional days to program exercise sessions.       Exercise Comments:     Exercise Comments    Row Name 01/04/16 [id number] 01/13/16 1430 01/27/16 1503 01/29/16 1034   Exercise Comments First full day of exercise!  Patient was oriented to gym and equipment including functions,  settings, policies, and procedures.  Patient's individual exercise prescription and treatment plan were reviewed.  All starting workloads were established based on the results of the 6 minute walk test done at initial orientation visit.  The plan for exercise progression was also introduced and progression will be customized based on patient's performance and goals. Reviewed home exercise with pt today.  Pt plans to walk and use resistance training equipment at home for exercise.  Reviewed THR, pulse, RPE, sign and symptoms, NTG use, and when to call 911 or MD.  Also discussed weather considerations and indoor options.  Pt voiced understanding. Al is off to a good start with exercise.  He is already exercising some at home.  He is on vacation for the next week visiting his daughter.  Al hopes to start HIIT once he returns.  We will continue to monitor his progression. Al continues to do well with exercise.  He is enjoying the HIIT.  We will continue to monitor his progress. Reviewed METs average and discussed progression with pt today.   [place] Name 02/11/16 1426 02/23/16 1529         Exercise Comments Al continues to do well in rehab.  He is enjoying his workouts.  He is playing golf now on Tues and Thurs.  We will continue to monitor his progression. Al comes to rehab consistently three days a week.  He continues to work hard.  We will monitor his progression.         Discharge Exercise Prescription (Final Exercise Prescription Changes):     Exercise Prescription Changes - 02/23/16 1500      Exercise Review   Progression Yes     Response to Exercise   Blood Pressure (Admit) 104/54   Blood Pressure (Exercise) 130/70   Blood Pressure (Exit) 110/60   Heart Rate (Admit) 65 bpm   Heart Rate (Exercise) 135 bpm   Heart Rate (Exit) 81 bpm   Rating of Perceived Exertion (Exercise) 12   Symptoms none   Comments Home Exercise Guidelines given 01/08/16   Duration Progress to 45 minutes of aerobic  exercise without signs/symptoms of physical distress   Intensity THRR unchanged     Progression   Progression Continue to progress workloads to maintain intensity without signs/symptoms of physical distress.   Average METs 5.73     Resistance Training   Training Prescription Yes   Weight 5 lbs   Reps 10-12     Interval Training   Interval Training Yes   Equipment NuStep;Treadmill   Comments 19mn off 30 sec on  interval program on treadmill     Treadmill   MPH 3.5   Grade 7   Minutes 15   METs 7.06     Elliptical   Level 3   Speed 4.5   Minutes 15     T5 Nustep   Level 7   Minutes 15   METs 4.4     Home Exercise Plan   Plans to continue exercise at Home  walking , hand weights, balance ball, bands   Frequency Add 3 additional days to program exercise sessions.      Nutrition:  Target Goals: Understanding of nutrition guidelines, daily intake of sodium <1507m cholesterol <20058mcalories 30% from fat and 7% or less from saturated fats, daily to have 5 or more servings of fruits and vegetables.  Biometrics:     Pre Biometrics - 12/31/15 1510      Pre Biometrics   Height 5' 9.5" (1.765 m)   Weight 201 lb 6.4 oz (91.4 kg)   Waist Circumference 38 inches   Hip Circumference 39 inches   Waist to Hip Ratio 0.97 %   BMI (Calculated) 29.4   Single Leg Stand 30 seconds       Nutrition Therapy Plan and Nutrition Goals:     Nutrition Therapy & Goals - 01/22/16 1201      Nutrition Therapy   Diet Instructed patient on a meal plan based on heart healthy dietary guidelines   Drug/Food Interactions Statins/Certain Fruits   Protein (specify units) 8   Fiber 30 grams   Whole Grain Foods 3 servings   Saturated Fats 12 max. grams   Fruits and Vegetables 5 servings/day   Sodium 1500 grams     Personal Nutrition Goals   Personal Goal #1 Increase water intake to 3(16oz) bottles per day.   Personal Goal #2 Strive  for 8 servings of fruits and vegetables per day    Personal Goal #3 Use myfitnesspal app or caloriekingapp to look up nutrition information especially for meals eaten "out".   Personal Goal #4 Add more dried beans to diet     Intervention Plan   Intervention Prescribe, educate and counsel regarding individualized specific dietary modifications aiming towards targeted core components such as weight, hypertension, lipid management, diabetes, heart failure and other comorbidities.;Nutrition handout(s) given to patient.   Expected Outcomes Short Term Goal: Understand basic principles of dietary content, such as calories, fat, sodium, cholesterol and nutrients.      Nutrition Discharge: Rate Your Plate Scores:     Nutrition Assessments - 02/29/16 0813      Rate Your Plate Scores   Pre Score 75   Pre Score % 83 %   Post Score 75   Post Score % 83.3 %   % Change 0.3 %      Nutrition Goals Re-Evaluation:     Nutrition Goals Re-Evaluation    Row Name 01/20/16 1410 02/10/16 0904           Personal Goal #1 Re-Evaluation   Personal Goal #1 Increase water intake to 3(16oz) bottles daily. Increase water intake to 3(16oz) bottles per day.      Goal Progress Seen Met Met      Comments Al is drinking 3-4 16 oz bottles daily as well as his coffee and juice/milk. Al is drinking 3-4 16 oz bottles daily as well as his coffee and juice/milk.        Personal Goal #2 Re-Evaluation   Personal Goal #2 Strive for 8 servings of fruits/vegetables daily Strive for 8 servings of fruits and vegetables per day      Goal Progress Seen Met Met      Comments Getting between 6-8 servings per day. Still getting about 6 servings        Personal Goal #3 Re-Evaluation   Personal Goal #3 Use myfitness pal or calorieking to look up nutrition information especially when eating "out". Use myfitnesspal app or caloriekingapp to look up nutrition information especially for meals eaten "out".      Goal Progress Seen No No      Comments Not using app, but making more  mindful choices Does not want to use app        Personal Goal #4 Re-Evaluation   Personal Goal #4 Add more beans to diet. Add more dried beans to diet      Goal Progress Seen Yes No      Comments Loves chili, but not a big bean eater.  He is trying to eat a few more. Has stayed away from the beans as the gas was too uncomfortable        Weight   Current Weight 202 lb (91.6 kg) 195 lb (88.5 kg)        Intervention Plan   Intervention Nutrition handout(s) given to patient.  -      Comments Al has met his goals or gotten as close to it as possible.  -         Psychosocial: Target Goals: Acknowledge presence or absence of depression, maximize coping skills, provide positive support system. Participant is able to verbalize types and ability to use techniques and skills needed for reducing stress and depression.  Initial Review & Psychosocial Screening:     Initial Psych Review & Screening - 12/31/15 [place]  Good Support System? Yes   Comments Al recently moved from [place] and was never really sick until he needed open heart surgery. He feels he did very well post op. Al's wife is a breast cancer survivor diagnosed 5 years ago and she now volunters at the [facility].       Quality of Life Scores:     Quality of Life - 02/29/16 0817      Quality of Life Scores   Health/Function Pre 27.5 %   Health/Function Post 28.23 %   Health/Function % Change 2.65 %   Socioeconomic Pre 28.93 %   Socioeconomic Post 29.64 %   Socioeconomic % Change  2.45 %   Psych/Spiritual Pre 30 %   Psych/Spiritual Post 28.93 %   Psych/Spiritual % Change -3.57 %   Family Pre 27.6 %   Family Post 26.4 %   Family % Change -4.35 %   GLOBAL Pre 28.35 %   GLOBAL Post 28.4 %   GLOBAL % Change 0.18 %      PHQ-9: Recent Review Flowsheet Data    Depression screen [facility] 2/9 02/29/2016 12/31/2015   Decreased Interest 0 0   Down, Depressed, Hopeless 0 0   PHQ - 2 Score 0 0   Altered  sleeping 2 1   Tired, decreased energy 1 2   Change in appetite 0 0   Feeling bad or failure about yourself  0 0   Trouble concentrating 0 0   Moving slowly or fidgety/restless 0 0   Suicidal thoughts 0 0   PHQ-9 Score 3 3   Difficult doing work/chores - Not difficult at all      Psychosocial Evaluation and Intervention:     Psychosocial Evaluation - 01/11/16 0908      Psychosocial Evaluation & Interventions   Interventions Encouraged to exercise with the program and follow exercise prescription   Comments Counselor met with Mr. Aubuchon (Mr A) for initial psychosocial evaluation.  He is a well-adjusted 60 year old who had triple by-pass surgery this past August.  He has a strong support system with a spouse of 59 years and her family who live close by, as well as (2) adult daughters who are away at college out of state.  He reports other than his heart he has very few health issues, with the exception of glaucoma.  Mr. Loni Muse has had difficulty sleeping over the past 2 years reporting interrupted sleep most nights.  He has tried OTC Melatonin while in the hospital but not since D/C.  Counselor asked Mr. A to check with his Dr. or Pharmacist about Sustained Release Melatonin to help him be able to get back to sleep more easily if awakened in the night.  Mr. Loni Muse states his appetite is "excellent" and he has no history or current symptoms of depression or anxiety.  He reports typically being in a positive mood and other than concerns on occasion about his daughters away at school, he has minimal stress.  Mr. Loni Muse has goals to increase his strength and stamina and to just get "heart Healthy" so he can get back on the golf course.  He plans to join a gym following completion of this program to maintain consistency in his workout program.  Counselor will be following with Mr. A while in this program and check on him about his sleep as well.         Psychosocial Re-Evaluation:     Psychosocial  Re-Evaluation  [place] Name 02/10/16 0905             Psychosocial Re-Evaluation   Interventions Relaxation education;Encouraged to attend Cardiac Rehabilitation for the exercise;Stress management education       Comments Al is feeling good overall.  He does continue to have trouble sleeping.  He tried the long acting melatonin, but continues to wake up at night.  He is still stresses about his health and wants to know he is getting better.  He does not have an echo scheduled until next year.  We will continue to follow up on his sleep.          Vocational Rehabilitation: Provide vocational rehab assistance to qualifying candidates.   Vocational Rehab Evaluation & Intervention:     Vocational Rehab - 12/31/15 1504      Initial Vocational Rehab Evaluation & Intervention   Assessment shows need for Vocational Rehabilitation No      Education: Education Goals: Education classes will be provided on a weekly basis, covering required topics. Participant will state understanding/return demonstration of topics presented.  Learning Barriers/Preferences:     Learning Barriers/Preferences - 12/31/15 1503      Learning Barriers/Preferences   Learning Barriers None   Learning Preferences None      Education Topics: General Nutrition Guidelines/Fats and Fiber: -Group instruction provided by verbal, written material, models and posters to present the general guidelines for heart healthy nutrition. Gives an explanation and review of dietary fats and fiber.   Controlling Sodium/Reading Food Labels: -Group verbal and written material supporting the discussion of sodium use in heart healthy nutrition. Review and explanation with models, verbal and written materials for utilization of the food label. Flowsheet Row Cardiac Rehab from 02/29/2016 in [facility] Cardiac and Pulmonary Rehab  Date  02/15/16  Educator  CR  Instruction Review Code  2- meets goals/outcomes      Exercise Physiology &  Risk Factors: - Group verbal and written instruction with models to review the exercise physiology of the cardiovascular system and associated critical values. Details cardiovascular disease risk factors and the goals associated with each risk factor. Flowsheet Row Cardiac Rehab from 02/29/2016 in [facility] Cardiac and Pulmonary Rehab  Date  02/03/16  Educator  [facility]  Instruction Review Code  2- meets goals/outcomes      Aerobic Exercise & Resistance Training: - Gives group verbal and written discussion on the health impact of inactivity. On the components of aerobic and resistive training programs and the benefits of this training and how to safely progress through these programs. Flowsheet Row Cardiac Rehab from 02/29/2016 in [facility] Cardiac and Pulmonary Rehab  Date  02/22/16  Educator  [facility]  Instruction Review Code  2- meets goals/outcomes      Flexibility, Balance, General Exercise Guidelines: - Provides group verbal and written instruction on the benefits of flexibility and balance training programs. Provides general exercise guidelines with specific guidelines to those with heart or lung disease. Demonstration and skill practice provided. Flowsheet Row Cardiac Rehab from 02/29/2016 in [facility] Cardiac and Pulmonary Rehab  Date  02/24/16  Educator  AS  Instruction Review Code  R- Review/reinforce [Second Class]      Stress Management: - Provides group verbal and written instruction about the health risks of elevated stress, cause of high stress, and healthy ways to reduce stress.   Depression: - Provides group verbal and written instruction on the correlation between heart/lung disease and depressed mood, treatment options, and the stigmas associated with seeking treatment. Flowsheet  Row Cardiac Rehab from 02/29/2016 in [facility] Cardiac and Pulmonary Rehab  Date  02/11/16  Educator  CE  Instruction Review Code  2- meets goals/outcomes      Anatomy & Physiology of the Heart: - Group verbal and  written instruction and models provide basic cardiac anatomy and physiology, with the coronary electrical and arterial systems. Review of: AMI, Angina, Valve disease, Heart Failure, Cardiac Arrhythmia, Pacemakers, and the ICD. Flowsheet Row Cardiac Rehab from 02/29/2016 in [facility] Cardiac and Pulmonary Rehab  Date  02/29/16  Educator  TS  Instruction Review Code  2- meets goals/outcomes      Cardiac Procedures: - Group verbal and written instruction and models to describe the testing methods done to diagnose heart disease. Reviews the outcomes of the test results. Describes the treatment choices: Medical Management, Angioplasty, or Coronary Bypass Surgery.   Cardiac Medications: - Group verbal and written instruction to review commonly prescribed medications for heart disease. Reviews the medication, class of the drug, and side effects. Includes the steps to properly store meds and maintain the prescription regimen. Flowsheet Row Cardiac Rehab from 02/29/2016 in [facility] Cardiac and Pulmonary Rehab  Date  01/20/16  Educator  SB  Instruction Review Code  2- meets goals/outcomes      Go Sex-Intimacy & Heart Disease, Get SMART - Goal Setting: - Group verbal and written instruction through game format to discuss heart disease and the return to sexual intimacy. Provides group verbal and written material to discuss and apply goal setting through the application of the S.M.A.R.T. Method.   Other Matters of the Heart: - Provides group verbal, written materials and models to describe Heart Failure, Angina, Valve Disease, and Diabetes in the realm of heart disease. Includes description of the disease process and treatment options available to the cardiac patient. Flowsheet Row Cardiac Rehab from 02/29/2016 in [facility] Cardiac and Pulmonary Rehab  Date  02/29/16 Marisue Humble 1]  Educator  TS  Instruction Review Code  2- meets goals/outcomes      Exercise & Equipment Safety: - Individual verbal instruction and  demonstration of equipment use and safety with use of the equipment. Flowsheet Row Cardiac Rehab from 02/29/2016 in [facility] Cardiac and Pulmonary Rehab  Date  12/31/15  Educator  C. Enterkin, RN  Instruction Review Code  1- partially meets, needs review/practice      Infection Prevention: - Provides verbal and written material to individual with discussion of infection control including proper hand washing and proper equipment cleaning during exercise session. Flowsheet Row Cardiac Rehab from 02/29/2016 in [facility] Cardiac and Pulmonary Rehab  Date  12/31/15  Educator  C. EnterkinRN  Instruction Review Code  2- meets goals/outcomes      Falls Prevention: - Provides verbal and written material to individual with discussion of falls prevention and safety. Flowsheet Row Cardiac Rehab from 02/29/2016 in [facility]  Date  12/31/15  Educator  C. Cowlington  Instruction Review Code  2- meets goals/outcomes      Diabetes: - Individual verbal and written instruction to review signs/symptoms of diabetes, desired ranges of glucose level fasting, after meals and with exercise. Advice that pre and post exercise glucose checks will be done for 3 sessions at entry of program.    Knowledge Questionnaire Score:     Knowledge Questionnaire Score - 02/29/16 0813      Knowledge Questionnaire Score   Pre Score 28/28   Post Score 28/28      Core Components/Risk Factors/Patient Goals at Admission:  Personal Goals and Risk Factors at Admission - 12/31/15 1509      Core Components/Risk Factors/Patient Goals on Admission    Weight Management Yes;Weight Maintenance   Intervention Weight Management: Develop a combined nutrition and exercise program designed to reach desired caloric intake, while maintaining appropriate intake of nutrient and fiber, sodium and fats, and appropriate energy expenditure required for the weight goal.;Weight Management: Provide education and appropriate  resources to help participant work on and attain dietary goals.   Expected Outcomes Weight Maintenance: Understanding of the daily nutrition guidelines, which includes 25-35% calories from fat, 7% or less cal from saturated fats, less than 230m cholesterol, less than 1.5gm of sodium, & 5 or more servings of fruits and vegetables daily   Increase Strength and Stamina Yes  Able to play golf and ride motorcycle again   Intervention Provide advice, education, support and counseling about physical activity/exercise needs.;Develop an individualized exercise prescription for aerobic and resistive training based on initial evaluation findings, risk stratification, comorbidities and participant's personal goals.   Expected Outcomes Achievement of increased cardiorespiratory fitness and enhanced flexibility, muscular endurance and strength shown through measurements of functional capacity and personal statement of participant.   Hypertension Yes   Intervention Provide education on lifestyle modifcations including regular physical activity/exercise, weight management, moderate sodium restriction and increased consumption of fresh fruit, vegetables, and low fat dairy, alcohol moderation, and smoking cessation.;Monitor prescription use compliance.   Expected Outcomes Short Term: Continued assessment and intervention until BP is < 140/992mHG in hypertensive participants. < 130/8052mG in hypertensive participants with diabetes, heart failure or chronic kidney disease.;Long Term: Maintenance of blood pressure at goal levels.      Core Components/Risk Factors/Patient Goals Review:      Goals and Risk Factor Review    Row Name 01/20/16 1406 02/10/16 0902           Core Components/Risk Factors/Patient Goals Review   Personal Goals Review Weight Management/Obesity;Sedentary;Increase Strength and Stamina;Hypertension;Lipids;Other Weight Management/Obesity;Sedentary;Increase Strength and  Stamina;Hypertension;Lipids;Other      Review Al is off to a good start in rehab.  He is exercising at home and when traveling!  He feels that his strength and stamina are getting better.  Blood pressures have been good here (not checking at home). No problems with taking medications. He has plans to play golf this afternoon.  He has not yet gotten back on his motorcycle, but he hopes to soon. Al is back on the golf course and feeling good.  He is walking and using weights at home two extra days a week.   Blood pressures have been good and he did have his blood work checked, but no results yet.  He has not taken his motorcycle out yet due to the cooler weather, but says as soon as we get a nice warm day, he will be on the road.  His weight has been steady at 195.      Expected Outcomes Al will continue to come to education and exercise classes to work on stamina, strength, and risk factor modification.  We will continue to monitor his weight and blood pressures. Al will continue to come to classes for risk factor modification.         Core Components/Risk Factors/Patient Goals at Discharge (Final Review):      Goals and Risk Factor Review - 02/10/16 0902      Core Components/Risk Factors/Patient Goals Review   Personal Goals Review Weight Management/Obesity;Sedentary;Increase Strength and Stamina;Hypertension;Lipids;Other   Review  Al is back on the golf course and feeling good.  He is walking and using weights at home two extra days a week.   Blood pressures have been good and he did have his blood work checked, but no results yet.  He has not taken his motorcycle out yet due to the cooler weather, but says as soon as we get a nice warm day, he will be on the road.  His weight has been steady at 195.   Expected Outcomes Al will continue to come to classes for risk factor modification.      ITP Comments:     ITP Comments    Row Name 12/31/15 1505 12/31/15 1506 12/31/15 1510 01/06/16 1153  02/03/16 0556   ITP Comments "Al" is ready to start in Cardiac Rehab. Appt made for him and his wife with Cardiac Rehab REgistered [place].  Al majored in Engineer, maintenance at [employer]. He scored 28/28 on his Pre Cardiac REhb questionaire Al recently moved from [place] and was never really sick until he needed open heart surgery. He feels he did very well post op. Al's wife is a breast cancer survivor diagnosed 5 years ago and she now volunters at the [facility].  30 day review. Continue with ITP unless changes noted by Medical Director at signature of review. 30 day review completed for Medical Director physician review and signature. Continue ITP unless changes made by physician.   [place] Name 02/05/16 0854 03/02/16 0645         ITP Comments Metoprolol decreased dose 75 mg down to 50 mg 2 x a day 30 day review completed for review by Dr Emily Filbert.  Continue with ITP unless changes noted by Dr Sabra Heck.         Comments:

## 2016-03-03 DIAGNOSIS — I214 Non-ST elevation (NSTEMI) myocardial infarction: Secondary | ICD-10-CM | POA: Diagnosis not present

## 2016-03-03 DIAGNOSIS — Z951 Presence of aortocoronary bypass graft: Secondary | ICD-10-CM

## 2016-03-03 NOTE — Progress Notes (Signed)
Cardiac Individual Treatment Plan  Patient Details  Name: Clinton Lyons MRN: [id number] Date of Birth: 12/20/1955 Referring Provider:   Flowsheet Row Cardiac Rehab from 12/31/2015 in [facility] Cardiac and Pulmonary Rehab  Referring Provider  Isaias Cowman MD      Initial Encounter Date:  Flowsheet Row Cardiac Rehab from 12/31/2015 in [facility] Cardiac and Pulmonary Rehab  Date  12/31/15  Referring Provider  Isaias Cowman MD      Visit Diagnosis: NSTEMI (non-ST elevated myocardial infarction) ([place])  S/P CABG x 3  Patient's Home Medications on Admission:  Current Outpatient Prescriptions:  .  acetaminophen (TYLENOL) 325 MG tablet, Take 2 tablets (650 mg total) by mouth every 6 (six) hours as needed for mild pain., Disp: , Rfl:  .  aspirin EC 325 MG EC tablet, Take 1 tablet (325 mg total) by mouth daily., Disp: 30 tablet, Rfl: 0 .  atorvastatin (LIPITOR) 40 MG tablet, Take 1 tablet (40 mg total) by mouth daily at 6 PM., Disp: 30 tablet, Rfl: 1 .  Cholecalciferol (VITAMIN D3) 2000 units capsule, Take 4,000 Units by mouth every morning. , Disp: , Rfl:  .  glucosamine-chondroitin 500-400 MG tablet, Take 1 tablet by mouth every morning., Disp: , Rfl:  .  latanoprost (XALATAN) 0.005 % ophthalmic solution, Place 1 drop into the right eye at bedtime., Disp: , Rfl:  .  lisinopril (PRINIVIL,ZESTRIL) 5 MG tablet, Take by mouth daily. , Disp: , Rfl:  .  metoprolol tartrate (LOPRESSOR) 25 MG tablet, Take 50 mg by mouth 2 (two) times daily. , Disp: , Rfl:  .  Multiple Vitamins-Minerals (PX ADVANCED FORMULA MULTIVITS) TABS, Take 1 tablet by mouth every morning., Disp: , Rfl:   Past Medical History: Past Medical History:  Diagnosis Date  . CAD (coronary artery disease) 11/17/2015  . Essential hypertension 11/15/2015  . Glaucoma   . NSTEMI (non-ST elevated myocardial infarction) ([place]) 11/16/2015  . 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 Never Smoker  Smokeless Tobacco  . Never Used    Labs: Recent Review Flowsheet Data    Labs for ITP Cardiac and Pulmonary Rehab Latest Ref Rng & Units 11/19/2015 11/19/2015 11/20/2015 11/20/2015 11/20/2015   Cholestrol 0 - 200 mg/dL - - - - -   LDLCALC 0 - 99 mg/dL - - - - -   HDL >40 mg/dL - - - - -   Trlycerides <150 mg/dL - - - - -   Hemoglobin A1c 4.8 - 5.6 % - - - - -   PHART 7.350 - 7.450 7.424 7.317(L) 7.395 7.410 -   PCO2ART 35.0 - 45.0 mmHg 34.7(L) 47.0(H) 40.4 38.7 -   HCO3 20.0 - 24.0 mEq/L 22.7 24.2(H) 24.6(H) 24.3(H) -   TCO2 0 - 100 mmol/L '24 26 26 25 22   ' ACIDBASEDEF 0.0 - 2.0 mmol/L 1.0 2.0 - - -   O2SAT % 98.0 97.0 94.0 94.0 -       Exercise Target Goals:    Exercise Program Goal: Individual exercise prescription set with THRR, safety & activity barriers. Participant demonstrates ability to understand and report RPE using BORG scale, to self-measure pulse accurately, and to acknowledge the importance of the exercise prescription.  Exercise Prescription Goal: Starting with aerobic activity 30 plus minutes a day, 3 days per week for initial exercise prescription. Provide home exercise prescription and guidelines that participant  acknowledges understanding prior to discharge.  Activity Barriers & Risk Stratification:     Activity Barriers & Cardiac Risk Stratification - 12/31/15 1502      Activity Barriers & Cardiac Risk Stratification   Activity Barriers None   Cardiac Risk Stratification High      6 Minute Walk:     6 Minute Walk    Row Name 12/31/15 1504 02/24/16 0855       6 Minute Walk   Phase Initial Discharge    Distance 1660 feet 1836 feet    Distance % Change  - 10.6 %  176 ft    Walk Time 6 minutes 6 minutes    # of Rest Breaks 0 0    MPH 3.14 3.48    METS 3.89 4.15    RPE 7 7    VO2 Peak 13.62 14.53    Symptoms No No    Resting HR 61 bpm 89 bpm    Resting BP 102/60 126/66    Max Ex. HR  104 bpm 108 bpm    Max Ex. BP 126/60 114/70    2 Minute Post BP 114/60  -       Initial Exercise Prescription:     Initial Exercise Prescription - 12/31/15 1500      Date of Initial Exercise RX and Referring Provider   Date 12/31/15   Referring Provider Isaias Cowman MD     Treadmill   MPH 3   Grade 1.5   Minutes 15   METs 3.92     Elliptical   Level 2   Speed 3.8   Minutes 15   METs 6.5     T5 Nustep   Level 3   Watts --  80-100 spm   Minutes 15   METs 3     Prescription Details   Frequency (times per week) 3   Duration Progress to 45 minutes of aerobic exercise without signs/symptoms of physical distress     Intensity   THRR 40-80% of Max Heartrate 99-139   Ratings of Perceived Exertion 11-15   Perceived Dyspnea 0-4     Progression   Progression Continue to progress workloads to maintain intensity without signs/symptoms of physical distress.     Resistance Training   Training Prescription Yes   Weight 4 lbs   Reps 10-12      Perform Capillary Blood Glucose checks as needed.  Exercise Prescription Changes:     Exercise Prescription Changes    Row Name 12/31/15 1500 01/13/16 1400 01/27/16 1500 02/23/16 1500       Exercise Review   Progression -  walk test results Yes Yes Yes      Response to Exercise   Blood Pressure (Admit) 102/60 110/64 114/60 104/54    Blood Pressure (Exercise) 126/60 124/70 122/70 130/70    Blood Pressure (Exit) 114/60 100/64 92/64 110/60    Heart Rate (Admit) 61 bpm 71 bpm 55 bpm 65 bpm    Heart Rate (Exercise) 104 bpm 117 bpm 129 bpm 135 bpm    Heart Rate (Exit)  - 86 bpm 99 bpm 81 bpm    Oxygen Saturation (Admit) 100 %  -  -  -    Oxygen Saturation (Exercise) 96 %  -  -  -    Rating of Perceived Exertion (Exercise) '7 12 11 12    ' Symptoms none none none none    Comments  - Home Exercise Guidelines given 01/08/16 Home Exercise Guidelines given 01/08/16  Home Exercise Guidelines given 01/08/16    Duration  -  Progress to 45 minutes of aerobic exercise without signs/symptoms of physical distress Progress to 45 minutes of aerobic exercise without signs/symptoms of physical distress Progress to 45 minutes of aerobic exercise without signs/symptoms of physical distress    Intensity  - THRR unchanged THRR unchanged THRR unchanged      Progression   Progression  - Continue to progress workloads to maintain intensity without signs/symptoms of physical distress. Continue to progress workloads to maintain intensity without signs/symptoms of physical distress. Continue to progress workloads to maintain intensity without signs/symptoms of physical distress.    Average METs  - 4.27 4.73 5.73      Resistance Training   Training Prescription  - Yes Yes Yes    Weight  - 5 lbs 5 lbs 5 lbs    Reps  - 10-12 10-12 10-12      Interval Training   Interval Training  - No Yes Yes    Equipment  -  - NuStep NuStep;Treadmill    Comments  -  - 46mn off 30 sec on 265m off 30 sec on  interval program on treadmill      Treadmill   MPH  - 3 3.5 3.5    Grade  - 1.'5 7 7    ' Minutes  - '15 15 15    ' METs  - 3.92 7.1 7.06      Elliptical   Level  - '3 3 3    ' Speed  - 3.8 3.8 4.5    Minutes  - '15 15 15    ' METs  - 6.5  -  -      T5 Nustep   Level  - '6 6 7    ' Minutes  - '15 15 15    ' METs  - 2.4 2.4 4.4      Home Exercise Plan   Plans to continue exercise at  - Home  walking , hand weights, balance ball, bands Home  walking , hand weights, balance ball, bands Home  walking , hand weights, balance ball, bands    Frequency  - Add 3 additional days to program exercise sessions. Add 3 additional days to program exercise sessions. Add 3 additional days to program exercise sessions.       Exercise Comments:     Exercise Comments    Row Name 01/04/16 [id number]/17 1016 01/13/16 1430 01/27/16 1503 01/29/16 1034   Exercise Comments First full day of exercise!  Patient was oriented to gym and equipment including functions,  settings, policies, and procedures.  Patient's individual exercise prescription and treatment plan were reviewed.  All starting workloads were established based on the results of the 6 minute walk test done at initial orientation visit.  The plan for exercise progression was also introduced and progression will be customized based on patient's performance and goals. Reviewed home exercise with pt today.  Pt plans to walk and use resistance training equipment at home for exercise.  Reviewed THR, pulse, RPE, sign and symptoms, NTG use, and when to call 911 or MD.  Also discussed weather considerations and indoor options.  Pt voiced understanding. Al is off to a good start with exercise.  He is already exercising some at home.  He is on vacation for the next week visiting his daughter.  Al hopes to start HIIT once he returns.  We will continue to monitor his progression. Al continues to do well with exercise.  He is enjoying the HIIT.  We will continue to monitor his progress. Reviewed METs average and discussed progression with pt today.   Row Name 02/11/16 1426 02/23/16 1529 03/03/16 1136       Exercise Comments Al continues to do well in rehab.  He is enjoying his workouts.  He is playing golf now on Tues and Thurs.  We will continue to monitor his progression. Al comes to rehab consistently three days a week.  He continues to work hard.  We will monitor his progression. Eliazer graduated today from cardiac rehab with 36 sessions completed.  Details of the patient's exercise prescription and what he needs to do in order to continue the prescription and progress were discussed with patient.  Patient was given a copy of prescription and goals.  Patient verbalized understanding.  Trip plans to continue to exercise by joining [employer] center or [employer].        Discharge Exercise Prescription (Final Exercise Prescription Changes):     Exercise Prescription Changes - 02/23/16 1500      Exercise Review    Progression Yes     Response to Exercise   Blood Pressure (Admit) 104/54   Blood Pressure (Exercise) 130/70   Blood Pressure (Exit) 110/60   Heart Rate (Admit) 65 bpm   Heart Rate (Exercise) 135 bpm   Heart Rate (Exit) 81 bpm   Rating of Perceived Exertion (Exercise) 12   Symptoms none   Comments Home Exercise Guidelines given 01/08/16   Duration Progress to 45 minutes of aerobic exercise without signs/symptoms of physical distress   Intensity THRR unchanged     Progression   Progression Continue to progress workloads to maintain intensity without signs/symptoms of physical distress.   Average METs 5.73     Resistance Training   Training Prescription Yes   Weight 5 lbs   Reps 10-12     Interval Training   Interval Training Yes   Equipment NuStep;Treadmill   Comments 49mn off 30 sec on  interval program on treadmill     Treadmill   MPH 3.5   Grade 7   Minutes 15   METs 7.06     Elliptical   Level 3   Speed 4.5   Minutes 15     T5 Nustep   Level 7   Minutes 15   METs 4.4     Home Exercise Plan   Plans to continue exercise at Home  walking , hand weights, balance ball, bands   Frequency Add 3 additional days to program exercise sessions.      Nutrition:  Target Goals: Understanding of nutrition guidelines, daily intake of sodium <15068m cholesterol <20064mcalories 30% from fat and 7% or less from saturated fats, daily to have 5 or more servings of fruits and vegetables.  Biometrics:     Pre Biometrics - 12/31/15 1510      Pre Biometrics   Height 5' 9.5" (1.765 m)   Weight 201 lb 6.4 oz (91.4 kg)   Waist Circumference 38 inches   Hip Circumference 39 inches   Waist to Hip Ratio 0.97 %   BMI (Calculated) 29.4   Single Leg Stand 30 seconds         Post Biometrics - 03/02/16 0938       Post  Biometrics   Height 5' 9.5" (1.765 m)   Weight 200 lb (90.7 kg)   Waist Circumference 38 inches   Hip Circumference 41 inches   Waist to  Hip Ratio 0.93 %    BMI (Calculated) 29.2   Single Leg Stand 30 seconds      Nutrition Therapy Plan and Nutrition Goals:     Nutrition Therapy & Goals - 01/22/16 1201      Nutrition Therapy   Diet Instructed patient on a meal plan based on heart healthy dietary guidelines   Drug/Food Interactions Statins/Certain Fruits   Protein (specify units) 8   Fiber 30 grams   Whole Grain Foods 3 servings   Saturated Fats 12 max. grams   Fruits and Vegetables 5 servings/day   Sodium 1500 grams     Personal Nutrition Goals   Personal Goal #1 Increase water intake to 3(16oz) bottles per day.   Personal Goal #2 Strive for 8 servings of fruits and vegetables per day   Personal Goal #3 Use myfitnesspal app or caloriekingapp to look up nutrition information especially for meals eaten "out".   Personal Goal #4 Add more dried beans to diet     Intervention Plan   Intervention Prescribe, educate and counsel regarding individualized specific dietary modifications aiming towards targeted core components such as weight, hypertension, lipid management, diabetes, heart failure and other comorbidities.;Nutrition handout(s) given to patient.   Expected Outcomes Short Term Goal: Understand basic principles of dietary content, such as calories, fat, sodium, cholesterol and nutrients.      Nutrition Discharge: Rate Your Plate Scores:     Nutrition Assessments - 02/29/16 0813      Rate Your Plate Scores   Pre Score 75   Pre Score % 83 %   Post Score 75   Post Score % 83.3 %   % Change 0.3 %      Nutrition Goals Re-Evaluation:     Nutrition Goals Re-Evaluation    Row Name 01/20/16 1410 02/10/16 0904           Personal Goal #1 Re-Evaluation   Personal Goal #1 Increase water intake to 3(16oz) bottles daily. Increase water intake to 3(16oz) bottles per day.      Goal Progress Seen Met Met      Comments Al is drinking 3-4 16 oz bottles daily as well as his coffee and juice/milk. Al is drinking 3-4 16 oz bottles  daily as well as his coffee and juice/milk.        Personal Goal #2 Re-Evaluation   Personal Goal #2 Strive for 8 servings of fruits/vegetables daily Strive for 8 servings of fruits and vegetables per day      Goal Progress Seen Met Met      Comments Getting between 6-8 servings per day. Still getting about 6 servings        Personal Goal #3 Re-Evaluation   Personal Goal #3 Use myfitness pal or calorieking to look up nutrition information especially when eating "out". Use myfitnesspal app or caloriekingapp to look up nutrition information especially for meals eaten "out".      Goal Progress Seen No No      Comments Not using app, but making more mindful choices Does not want to use app        Personal Goal #4 Re-Evaluation   Personal Goal #4 Add more beans to diet. Add more dried beans to diet      Goal Progress Seen Yes No      Comments Loves chili, but not a big bean eater.  He is trying to eat a few more. Has stayed away from the beans as the gas was too  uncomfortable        Weight   Current Weight 202 lb (91.6 kg) 195 lb (88.5 kg)        Intervention Plan   Intervention Nutrition handout(s) given to patient.  -      Comments Al has met his goals or gotten as close to it as possible.  -         Psychosocial: Target Goals: Acknowledge presence or absence of depression, maximize coping skills, provide positive support system. Participant is able to verbalize types and ability to use techniques and skills needed for reducing stress and depression.  Initial Review & Psychosocial Screening:     Initial Psych Review & Screening - 12/31/15 [place]? Yes   Comments Al recently moved from [place] and was never really sick until he needed open heart surgery. He feels he did very well post op. Al's wife is a breast cancer survivor diagnosed 5 years ago and she now volunters at the [facility].       Quality of Life Scores:     Quality of  Life - 02/29/16 0817      Quality of Life Scores   Health/Function Pre 27.5 %   Health/Function Post 28.23 %   Health/Function % Change 2.65 %   Socioeconomic Pre 28.93 %   Socioeconomic Post 29.64 %   Socioeconomic % Change  2.45 %   Psych/Spiritual Pre 30 %   Psych/Spiritual Post 28.93 %   Psych/Spiritual % Change -3.57 %   Family Pre 27.6 %   Family Post 26.4 %   Family % Change -4.35 %   GLOBAL Pre 28.35 %   GLOBAL Post 28.4 %   GLOBAL % Change 0.18 %      PHQ-9: Recent Review Flowsheet Data    Depression screen [facility] 2/9 02/29/2016 12/31/2015   Decreased Interest 0 0   Down, Depressed, Hopeless 0 0   PHQ - 2 Score 0 0   Altered sleeping 2 1   Tired, decreased energy 1 2   Change in appetite 0 0   Feeling bad or failure about yourself  0 0   Trouble concentrating 0 0   Moving slowly or fidgety/restless 0 0   Suicidal thoughts 0 0   PHQ-9 Score 3 3   Difficult doing work/chores - Not difficult at all      Psychosocial Evaluation and Intervention:     Psychosocial Evaluation - 01/11/16 0908      Psychosocial Evaluation & Interventions   Interventions Encouraged to exercise with the program and follow exercise prescription   Comments Counselor met with Mr. Biehn (Mr A) for initial psychosocial evaluation.  He is a well-adjusted 60 year old who had triple by-pass surgery this past August.  He has a strong support system with a spouse of 37 years and her family who live close by, as well as (2) adult daughters who are away at college out of state.  He reports other than his heart he has very few health issues, with the exception of glaucoma.  Mr. Loni Muse has had difficulty sleeping over the past 2 years reporting interrupted sleep most nights.  He has tried OTC Melatonin while in the hospital but not since D/C.  Counselor asked Mr. A to check with his Dr. or Pharmacist about Sustained Release Melatonin to help him be able to get back to sleep more easily if awakened in the  night.  Mr. Loni Muse states his appetite is "excellent" and he has no history or current symptoms of depression or anxiety.  He reports typically being in a positive mood and other than concerns on occasion about his daughters away at school, he has minimal stress.  Mr. Loni Muse has goals to increase his strength and stamina and to just get "heart Healthy" so he can get back on the golf course.  He plans to join a gym following completion of this program to maintain consistency in his workout program.  Counselor will be following with Mr. A while in this program and check on him about his sleep as well.         Psychosocial Re-Evaluation:     Psychosocial Re-Evaluation    [place] Name 02/10/16 0905             Psychosocial Re-Evaluation   Interventions Relaxation education;Encouraged to attend Cardiac Rehabilitation for the exercise;Stress management education       Comments Al is feeling good overall.  He does continue to have trouble sleeping.  He tried the long acting melatonin, but continues to wake up at night.  He is still stresses about his health and wants to know he is getting better.  He does not have an echo scheduled until next year.  We will continue to follow up on his sleep.          Vocational Rehabilitation: Provide vocational rehab assistance to qualifying candidates.   Vocational Rehab Evaluation & Intervention:     Vocational Rehab - 12/31/15 1504      Initial Vocational Rehab Evaluation & Intervention   Assessment shows need for Vocational Rehabilitation No      Education: Education Goals: Education classes will be provided on a weekly basis, covering required topics. Participant will state understanding/return demonstration of topics presented.  Learning Barriers/Preferences:     Learning Barriers/Preferences - 12/31/15 1503      Learning Barriers/Preferences   Learning Barriers None   Learning Preferences None      Education Topics: General Nutrition  Guidelines/Fats and Fiber: -Group instruction provided by verbal, written material, models and posters to present the general guidelines for heart healthy nutrition. Gives an explanation and review of dietary fats and fiber.   Controlling Sodium/Reading Food Labels: -Group verbal and written material supporting the discussion of sodium use in heart healthy nutrition. Review and explanation with models, verbal and written materials for utilization of the food label. Flowsheet Row Cardiac Rehab from 02/29/2016 in [name] Cardiac and Pulmonary Rehab  Date  02/15/16  Educator  CR  Instruction Review Code  2- meets goals/outcomes      Exercise Physiology & Risk Factors: - Group verbal and written instruction with models to review the exercise physiology of the cardiovascular system and associated critical values. Details cardiovascular disease risk factors and the goals associated with each risk factor. Flowsheet Row Cardiac Rehab from 02/29/2016 in [facility] Cardiac and Pulmonary Rehab  Date  02/03/16  Educator  [facility]  Instruction Review Code  2- meets goals/outcomes      Aerobic Exercise & Resistance Training: - Gives group verbal and written discussion on the health impact of inactivity. On the components of aerobic and resistive training programs and the benefits of this training and how to safely progress through these programs. Flowsheet Row Cardiac Rehab from 02/29/2016 in [facility] Cardiac and Pulmonary Rehab  Date  02/22/16  Educator  [facility]  Instruction Review Code  2- meets goals/outcomes  Flexibility, Balance, General Exercise Guidelines: - Provides group verbal and written instruction on the benefits of flexibility and balance training programs. Provides general exercise guidelines with specific guidelines to those with heart or lung disease. Demonstration and skill practice provided. Flowsheet Row Cardiac Rehab from 02/29/2016 in [facility] Cardiac and Pulmonary Rehab  Date  02/24/16  Educator  AS   Instruction Review Code  R- Review/reinforce [Second Class]      Stress Management: - Provides group verbal and written instruction about the health risks of elevated stress, cause of high stress, and healthy ways to reduce stress.   Depression: - Provides group verbal and written instruction on the correlation between heart/lung disease and depressed mood, treatment options, and the stigmas associated with seeking treatment. Flowsheet Row Cardiac Rehab from 02/29/2016 in [facility] Cardiac and Pulmonary Rehab  Date  02/11/16  Educator  CE  Instruction Review Code  2- meets goals/outcomes      Anatomy & Physiology of the Heart: - Group verbal and written instruction and models provide basic cardiac anatomy and physiology, with the coronary electrical and arterial systems. Review of: AMI, Angina, Valve disease, Heart Failure, Cardiac Arrhythmia, Pacemakers, and the ICD. Flowsheet Row Cardiac Rehab from 02/29/2016 in [facility] Cardiac and Pulmonary Rehab  Date  02/29/16  Educator  TS  Instruction Review Code  2- meets goals/outcomes      Cardiac Procedures: - Group verbal and written instruction and models to describe the testing methods done to diagnose heart disease. Reviews the outcomes of the test results. Describes the treatment choices: Medical Management, Angioplasty, or Coronary Bypass Surgery.   Cardiac Medications: - Group verbal and written instruction to review commonly prescribed medications for heart disease. Reviews the medication, class of the drug, and side effects. Includes the steps to properly store meds and maintain the prescription regimen. Flowsheet Row Cardiac Rehab from 02/29/2016 in [facility] Cardiac and Pulmonary Rehab  Date  01/20/16  Educator  SB  Instruction Review Code  2- meets goals/outcomes      Go Sex-Intimacy & Heart Disease, Get SMART - Goal Setting: - Group verbal and written instruction through game format to discuss heart disease and the return to sexual  intimacy. Provides group verbal and written material to discuss and apply goal setting through the application of the S.M.A.R.T. Method.   Other Matters of the Heart: - Provides group verbal, written materials and models to describe Heart Failure, Angina, Valve Disease, and Diabetes in the realm of heart disease. Includes description of the disease process and treatment options available to the cardiac patient. Flowsheet Row Cardiac Rehab from 02/29/2016 in [facility] Cardiac and Pulmonary Rehab  Date  02/29/16 Marisue Humble 1]  Educator  TS  Instruction Review Code  2- meets goals/outcomes      Exercise & Equipment Safety: - Individual verbal instruction and demonstration of equipment use and safety with use of the equipment. [facility] [facility]  Date  12/31/15  Educator  C. Enterkin, RN  Instruction Review Code  1- partially meets, needs review/practice      Infection Prevention: - Provides verbal and written material to individual with discussion of infection control including proper hand washing and proper equipment cleaning during exercise session. Flowsheet Row Cardiac Rehab from 02/29/2016 in [facility] Card[facility] Re[facility]  Date  12/31/15  Educator  C. EnterkinRN  Instruction Review Code  2- meets goals/outcomes      Falls Prevention: - Provides verbal and written material  to individual with discussion of falls prevention and safety. Flowsheet Row Cardiac Rehab from 02/29/2016 in [facility]  Date  12/31/15  Educator  C. Schleicher  Instruction Review Code  2- meets goals/outcomes      Diabetes: - Individual verbal and written instruction to review signs/symptoms of diabetes, desired ranges of glucose level fasting, after meals and with exercise. Advice that pre and post exercise glucose checks will be done for 3 sessions at entry of program.    Knowledge Questionnaire Score:     Knowledge  Questionnaire Score - 02/29/16 0813      Knowledge Questionnaire Score   Pre Score 28/28   Post Score 28/28      Core Components/Risk Factors/Patient Goals at Admission:     Personal Goals and Risk Factors at Admission - 12/31/15 1509      Core Components/Risk Factors/Patient Goals on Admission    Weight Management Yes;Weight Maintenance   Intervention Weight Management: Develop a combined nutrition and exercise program designed to reach desired caloric intake, while maintaining appropriate intake of nutrient and fiber, sodium and fats, and appropriate energy expenditure required for the weight goal.;Weight Management: Provide education and appropriate resources to help participant work on and attain dietary goals.   Expected Outcomes Weight Maintenance: Understanding of the daily nutrition guidelines, which includes 25-35% calories from fat, 7% or less cal from saturated fats, less than 239m cholesterol, less than 1.5gm of sodium, & 5 or more servings of fruits and vegetables daily   Increase Strength and Stamina Yes  Able to play golf and ride motorcycle again   Intervention Provide advice, education, support and counseling about physical activity/exercise needs.;Develop an individualized exercise prescription for aerobic and resistive training based on initial evaluation findings, risk stratification, comorbidities and participant's personal goals.   Expected Outcomes Achievement of increased cardiorespiratory fitness and enhanced flexibility, muscular endurance and strength shown through measurements of functional capacity and personal statement of participant.   Hypertension Yes   Intervention Provide education on lifestyle modifcations including regular physical activity/exercise, weight management, moderate sodium restriction and increased consumption of fresh fruit, vegetables, and low fat dairy, alcohol moderation, and smoking cessation.;Monitor prescription use compliance.    Expected Outcomes Short Term: Continued assessment and intervention until BP is < 140/923mHG in hypertensive participants. < 130/8015mG in hypertensive participants with diabetes, heart failure or chronic kidney disease.;Long Term: Maintenance of blood pressure at goal levels.      Core Components/Risk Factors/Patient Goals Review:      Goals and Risk Factor Review    Row Name 01/20/16 1406 02/10/16 0902           Core Components/Risk Factors/Patient Goals Review   Personal Goals Review Weight Management/Obesity;Sedentary;Increase Strength and Stamina;Hypertension;Lipids;Other Weight Management/Obesity;Sedentary;Increase Strength and Stamina;Hypertension;Lipids;Other      Review Al is off to a good start in rehab.  He is exercising at home and when traveling!  He feels that his strength and stamina are getting better.  Blood pressures have been good here (not checking at home). No problems with taking medications. He has plans to play golf this afternoon.  He has not yet gotten back on his motorcycle, but he hopes to soon. Al is back on the golf course and feeling good.  He is walking and using weights at home two extra days a week.   Blood pressures have been good and he did have his blood work checked, but no results yet.  He has not taken his  motorcycle out yet due to the cooler weather, but says as soon as we get a nice warm day, he will be on the road.  His weight has been steady at 195.      Expected Outcomes Al will continue to come to education and exercise classes to work on stamina, strength, and risk factor modification.  We will continue to monitor his weight and blood pressures. Al will continue to come to classes for risk factor modification.         Core Components/Risk Factors/Patient Goals at Discharge (Final Review):      Goals and Risk Factor Review - 02/10/16 0902      Core Components/Risk Factors/Patient Goals Review   Personal Goals Review Weight  Management/Obesity;Sedentary;Increase Strength and Stamina;Hypertension;Lipids;Other   Review Al is back on the golf course and feeling good.  He is walking and using weights at home two extra days a week.   Blood pressures have been good and he did have his blood work checked, but no results yet.  He has not taken his motorcycle out yet due to the cooler weather, but says as soon as we get a nice warm day, he will be on the road.  His weight has been steady at 195.   Expected Outcomes Al will continue to come to classes for risk factor modification.      ITP Comments:     ITP Comments    Row Name 12/31/15 1505 12/31/15 1506 12/31/15 1510 01/06/16 1153 02/03/16 0556   ITP Comments "Al" is ready to start in Cardiac Rehab. Appt made for him and his wife with [facility] [place].  Al majored in Engineer, maintenance at [employer]. He scored 28/28 on his Pre Cardiac REhb questionaire Al recently moved from [place] and was never really sick until he needed open heart surgery. He feels he did very well post op. Al's wife is a breast cancer survivor diagnosed 5 years ago and she now volunters at the [facility].  30 day review. Continue with ITP unless changes noted by Medical Director at signature of review. 30 day review completed for Medical Director physician review and signature. Continue ITP unless changes made by physician.   [place] Name 02/05/16 0854 03/02/16 0645         ITP Comments Metoprolol decreased dose 75 mg down to 50 mg 2 x a day 30 day review completed for review by Dr Emily Filbert.  Continue with ITP unless changes noted by Dr Sabra Heck.         Comments: Discharge ITP

## 2016-03-03 NOTE — Progress Notes (Signed)
Daily Session Note  Patient Details  Name: Clinton Lyons MRN: [id number] Date of Birth: [birth year] Referring Provider:   Flowsheet Row Cardiac Rehab from 12/31/2015 in [facility] Re[facility]  Referring Provider  Isaias Cowman MD      Encounter Date: 03/03/2016  Check In:     Session Check In - 03/03/16 0927      Check-In   Location [facility]-Cardiac & Pulmonary Rehab   Staff Present Alberteen Sam, MA, ACSM RCEP, Exercise Physiologist;Amanda Oletta Darter, BA, ACSM CEP, Exercise Physiologist;Other  Jena Gauss, RN   Supervising physician immediately available to respond to emergencies See telemetry face sheet for immediately available ER MD   Medication changes reported     No   Fall or balance concerns reported    No   Warm-up and Cool-down Performed on first and last piece of equipment   Resistance Training Performed Yes   VAD Patient? No     Pain Assessment   Currently in Pain? No/denies         Goals Met:  Proper associated with RPD/PD & O2 Sat Independence with exercise equipment Exercise tolerated well Strength training completed today  Goals Unmet:  Not Applicable  Comments:  Clinton Lyons graduated today from cardiac rehab with 36 sessions completed.  Details of the patient's exercise prescription and what he needs to do in order to continue the prescription and progress were discussed with patient.  Patient was given a copy of prescription and goals.  Patient verbalized understanding.  Clinton Lyons plans to continue to exercise by joining [employer] center or [employer].    Dr. Emily Filbert is Medical Director for [place] and [facility].

## 2016-03-03 NOTE — Progress Notes (Signed)
Discharge Summary  Patient Details  Name: Clinton Lyons MRN: [id number] Date of Birth: 26-Feb-1956 Referring Provider:   Flowsheet Row Cardiac Rehab from 12/31/2015 in [facility]  Referring Provider  Clinton Cowman MD       Number of Visits:  36  Reason for Discharge:  Patient reached a stable level of exercise. Patient independent in their exercise.  Smoking History:  History  Smoking Status  . Never Smoker  Smokeless Tobacco  . Never Used    Diagnosis:  NSTEMI (non-ST elevated myocardial infarction) (Clinton Lyons)  S/P CABG x 3  ADL UCSD:   Initial Exercise Prescription:     Initial Exercise Prescription - 12/31/15 1500      Date of Initial Exercise RX and Referring Provider   Date 12/31/15   Referring Provider Clinton Cowman MD     Treadmill   MPH 3   Grade 1.5   Minutes 15   METs 3.92     Elliptical   Level 2   Speed 3.8   Minutes 15   METs 6.5     T5 Nustep   Level 3   Watts --  80-100 spm   Minutes 15   METs 3     Prescription Details   Frequency (times per week) 3   Duration Progress to 45 minutes of aerobic exercise without signs/symptoms of physical distress     Intensity   THRR 40-80% of Max Heartrate 99-139   Ratings of Perceived Exertion 11-15   Perceived Dyspnea 0-4     Progression   Progression Continue to progress workloads to maintain intensity without signs/symptoms of physical distress.     Resistance Training   Training Prescription Yes   Weight 4 lbs   Reps 10-12      Discharge Exercise Prescription (Final Exercise Prescription Changes):     Exercise Prescription Changes - 02/23/16 1500      Exercise Review   Progression Yes     Response to Exercise   Blood Pressure (Admit) 104/54   Blood Pressure (Exercise) 130/70   Blood Pressure (Exit) 110/60   Heart Rate (Admit) 65 bpm   Heart Rate (Exercise) 135 bpm   Heart Rate (Exit) 81 bpm   Rating of Perceived Exertion (Exercise) 12    Symptoms none   Comments Home Exercise Guidelines given 01/08/16   Duration Progress to 45 minutes of aerobic exercise without signs/symptoms of physical distress   Intensity THRR unchanged     Progression   Progression Continue to progress workloads to maintain intensity without signs/symptoms of physical distress.   Average METs 5.73     Resistance Training   Training Prescription Yes   Weight 5 lbs   Reps 10-12     Interval Training   Interval Training Yes   Equipment NuStep;Treadmill   Comments 64min off 30 sec on  interval program on treadmill     Treadmill   MPH 3.5   Grade 7   Minutes 15   METs 7.06     Elliptical   Level 3   Speed 4.5   Minutes 15     T5 Nustep   Level 7   Minutes 15   METs 4.4     Home Exercise Plan   Plans to continue exercise at Home  walking , hand weights, balance ball, bands   Frequency Add 3 additional days to program exercise sessions.      Functional Capacity:     6 Minute Walk  [place] Name 12/31/15 1504 02/24/16 0855       6 Minute Walk   Phase Initial Discharge    Distance 1660 feet 1836 feet    Distance % Change  - 10.6 %  176 ft    Walk Time 6 minutes 6 minutes    # of Rest Breaks 0 0    MPH 3.14 3.48    METS 3.89 4.15    RPE 7 7    VO2 Peak 13.62 14.53    Symptoms No No    Resting HR 61 bpm 89 bpm    Resting BP 102/60 126/66    Max Ex. HR 104 bpm 108 bpm    Max Ex. BP 126/60 114/70    2 Minute Post BP 114/60  -       Psychological, QOL, Others - Outcomes: PHQ 2/9: Depression screen [facility] 2/9 02/29/2016 12/31/2015  Decreased Interest 0 0  Down, Depressed, Hopeless 0 0  PHQ - 2 Score 0 0  Altered sleeping 2 1  Tired, decreased energy 1 2  Change in appetite 0 0  Feeling bad or failure about yourself  0 0  Trouble concentrating 0 0  Moving slowly or fidgety/restless 0 0  Suicidal thoughts 0 0  PHQ-9 Score 3 3  Difficult doing work/chores - Not difficult at all    Quality of Life:     Quality of  Life - 02/29/16 0817      Quality of Life Scores   Health/Function Pre 27.5 %   Health/Function Post 28.23 %   Health/Function % Change 2.65 %   Socioeconomic Pre 28.93 %   Socioeconomic Post 29.64 %   Socioeconomic % Change  2.45 %   Psych/Spiritual Pre 30 %   Psych/Spiritual Post 28.93 %   Psych/Spiritual % Change -3.57 %   Family Pre 27.6 %   Family Post 26.4 %   Family % Change -4.35 %   GLOBAL Pre 28.35 %   GLOBAL Post 28.4 %   GLOBAL % Change 0.18 %      Personal Goals: Goals established at orientation with interventions provided to work toward goal.     Personal Goals and Risk Factors at Admission - 12/31/15 1509      Core Components/Risk Factors/Patient Goals on Admission    Weight Management Yes;Weight Maintenance   Intervention Weight Management: Develop a combined nutrition and exercise program designed to reach desired caloric intake, while maintaining appropriate intake of nutrient and fiber, sodium and fats, and appropriate energy expenditure required for the weight goal.;Weight Management: Provide education and appropriate resources to help participant work on and attain dietary goals.   Expected Outcomes Weight Maintenance: Understanding of the daily nutrition guidelines, which includes 25-35% calories from fat, 7% or less cal from saturated fats, less than 200mg  cholesterol, less than 1.5gm of sodium, & 5 or more servings of fruits and vegetables daily   Increase Strength and Stamina Yes  Able to play golf and ride motorcycle again   Intervention Provide advice, education, support and counseling about physical activity/exercise needs.;Develop an individualized exercise prescription for aerobic and resistive training based on initial evaluation findings, risk stratification, comorbidities and participant's personal goals.   Expected Outcomes Achievement of increased cardiorespiratory fitness and enhanced flexibility, muscular endurance and strength shown through  measurements of functional capacity and personal statement of participant.   Hypertension Yes   Intervention Provide education on lifestyle modifcations including regular physical activity/exercise, weight management, moderate sodium restriction and increased consumption  of fresh fruit, vegetables, and low fat dairy, alcohol moderation, and smoking cessation.;Monitor prescription use compliance.   Expected Outcomes Short Term: Continued assessment and intervention until BP is < 140/45mm HG in hypertensive participants. < 130/74mm HG in hypertensive participants with diabetes, heart failure or chronic kidney disease.;Long Term: Maintenance of blood pressure at goal levels.       Personal Goals Discharge:     Goals and Risk Factor Review    Row Name 01/20/16 1406 02/10/16 0902           Core Components/Risk Factors/Patient Goals Review   Personal Goals Review Weight Management/Obesity;Sedentary;Increase Strength and Stamina;Hypertension;Lipids;Other Weight Management/Obesity;Sedentary;Increase Strength and Stamina;Hypertension;Lipids;Other      Review Al is off to a good start in rehab.  He is exercising at home and when traveling!  He feels that his strength and stamina are getting better.  Blood pressures have been good here (not checking at home). No problems with taking medications. He has plans to play golf this afternoon.  He has not yet gotten back on his motorcycle, but he hopes to soon. Al is back on the golf course and feeling good.  He is walking and using weights at home two extra days a week.   Blood pressures have been good and he did have his blood work checked, but no results yet.  He has not taken his motorcycle out yet due to the cooler weather, but says as soon as we get a nice warm day, he will be on the road.  His weight has been steady at 195.      Expected Outcomes Al will continue to come to education and exercise classes to work on stamina, strength, and risk factor  modification.  We will continue to monitor his weight and blood pressures. Al will continue to come to classes for risk factor modification.         Nutrition & Weight - Outcomes:     Pre Biometrics - 12/31/15 1510      Pre Biometrics   Height 5' 9.5" (1.765 m)   Weight 201 lb 6.4 oz (91.4 kg)   Waist Circumference 38 inches   Hip Circumference 39 inches   Waist to Hip Ratio 0.97 %   BMI (Calculated) 29.4   Single Leg Stand 30 seconds         Post Biometrics - 03/02/16 [id number]       Post  Biometrics   Height 5' 9.5" (1.765 m)   Weight 200 lb (90.7 kg)   Waist Circumference 38 inches   Hip Circumference 41 inches   Waist to Hip Ratio 0.93 %   BMI (Calculated) 29.2   Single Leg Stand 30 seconds      Nutrition:     Nutrition Therapy & Goals - 01/22/16 1201      Nutrition Therapy   Diet Instructed patient on a meal plan based on heart healthy dietary guidelines   Drug/Food Interactions Statins/Certain Fruits   Protein (specify units) 8   Fiber 30 grams   Whole Grain Foods 3 servings   Saturated Fats 12 max. grams   Fruits and Vegetables 5 servings/day   Sodium 1500 grams     Personal Nutrition Goals   Personal Goal #1 Increase water intake to 3(16oz) bottles per day.   Personal Goal #2 Strive for 8 servings of fruits and vegetables per day   Personal Goal #3 Use myfitnesspal app or caloriekingapp to look up nutrition information especially for meals eaten "  out".   Personal Goal #4 Add more dried beans to diet     Intervention Plan   Intervention Prescribe, educate and counsel regarding individualized specific dietary modifications aiming towards targeted core components such as weight, hypertension, lipid management, diabetes, heart failure and other comorbidities.;Nutrition handout(s) given to patient.   Expected Outcomes Short Term Goal: Understand basic principles of dietary content, such as calories, fat, sodium, cholesterol and nutrients.      Nutrition  Discharge:     Nutrition Assessments - 02/29/16 0813      Rate Your Plate Scores   Pre Score 75   Pre Score % 83 %   Post Score 75   Post Score % 83.3 %   % Change 0.3 %      Education Questionnaire Score:     Knowledge Questionnaire Score - 02/29/16 0813      Knowledge Questionnaire Score   Pre Score 28/28   Post Score 28/28      Goals reviewed with patient; copy given to patient.

## 2016-09-30 ENCOUNTER — Encounter: Payer: Self-pay | Admitting: *Deleted

## 2016-10-03 ENCOUNTER — Ambulatory Visit
Admission: RE | Admit: 2016-10-03 | Discharge: 2016-10-03 | Disposition: A | Discharge status: Home / Self Care | Payer: BLUE CROSS/BLUE SHIELD | Source: Ambulatory Visit | Attending: Unknown Physician Specialty | Admitting: Unknown Physician Specialty

## 2016-10-03 ENCOUNTER — Ambulatory Visit: Payer: BLUE CROSS/BLUE SHIELD | Admitting: Anesthesiology

## 2016-10-03 ENCOUNTER — Encounter: Payer: Self-pay | Admitting: *Deleted

## 2016-10-03 ENCOUNTER — Encounter
Admission: RE | Disposition: A | Discharge status: Home / Self Care | Payer: Self-pay | Source: Ambulatory Visit | Attending: Unknown Physician Specialty

## 2016-10-03 DIAGNOSIS — D128 Benign neoplasm of rectum: Secondary | ICD-10-CM | POA: Insufficient documentation

## 2016-10-03 DIAGNOSIS — K5730 Diverticulosis of large intestine without perforation or abscess without bleeding: Secondary | ICD-10-CM | POA: Insufficient documentation

## 2016-10-03 DIAGNOSIS — Z7982 Long term (current) use of aspirin: Secondary | ICD-10-CM | POA: Insufficient documentation

## 2016-10-03 DIAGNOSIS — K640 First degree hemorrhoids: Secondary | ICD-10-CM | POA: Diagnosis not present

## 2016-10-03 DIAGNOSIS — Z1211 Encounter for screening for malignant neoplasm of colon: Secondary | ICD-10-CM | POA: Insufficient documentation

## 2016-10-03 DIAGNOSIS — Z8249 Family history of ischemic heart disease and other diseases of the circulatory system: Secondary | ICD-10-CM | POA: Diagnosis not present

## 2016-10-03 DIAGNOSIS — I10 Essential (primary) hypertension: Secondary | ICD-10-CM | POA: Insufficient documentation

## 2016-10-03 DIAGNOSIS — Z86010 Personal history of colonic polyps: Secondary | ICD-10-CM | POA: Insufficient documentation

## 2016-10-03 DIAGNOSIS — Z951 Presence of aortocoronary bypass graft: Secondary | ICD-10-CM | POA: Insufficient documentation

## 2016-10-03 DIAGNOSIS — I252 Old myocardial infarction: Secondary | ICD-10-CM | POA: Diagnosis not present

## 2016-10-03 DIAGNOSIS — Z79899 Other long term (current) drug therapy: Secondary | ICD-10-CM | POA: Insufficient documentation

## 2016-10-03 DIAGNOSIS — I2510 Atherosclerotic heart disease of native coronary artery without angina pectoris: Secondary | ICD-10-CM | POA: Diagnosis not present

## 2016-10-03 HISTORY — PX: COLONOSCOPY WITH PROPOFOL: SHX5780

## 2016-10-03 HISTORY — DX: Personal history of colonic polyps: Z86.010

## 2016-10-03 HISTORY — DX: Unspecified hemorrhoids: K64.9

## 2016-10-03 SURGERY — COLONOSCOPY WITH PROPOFOL
Anesthesia: General

## 2016-10-03 MED ORDER — SODIUM CHLORIDE 0.9 % IV SOLN
INTRAVENOUS | Status: DC
  Administered 2016-10-03: 1000 mL via INTRAVENOUS

## 2016-10-03 MED ORDER — SODIUM CHLORIDE 0.9 % IV SOLN: INTRAVENOUS | Status: DC

## 2016-10-03 MED ORDER — PROPOFOL 500 MG/50ML IV EMUL
INTRAVENOUS | Status: AC
  Filled 2016-10-03: qty 50

## 2016-10-03 MED ORDER — MIDAZOLAM HCL 2 MG/2ML IJ SOLN
INTRAMUSCULAR | Status: AC
  Filled 2016-10-03: qty 2

## 2016-10-03 MED ORDER — FENTANYL CITRATE (PF) 100 MCG/2ML IJ SOLN
INTRAMUSCULAR | Status: AC
  Filled 2016-10-03: qty 2

## 2016-10-03 MED ORDER — SODIUM CHLORIDE 0.9 % IV SOLN
INTRAVENOUS | Status: DC
  Administered 2016-10-03: 14:00:00 via INTRAVENOUS

## 2016-10-03 MED ORDER — PHENYLEPHRINE HCL 10 MG/ML IJ SOLN
INTRAMUSCULAR | Status: DC | PRN
  Administered 2016-10-03 (×2): 100 ug via INTRAVENOUS

## 2016-10-03 MED ORDER — MIDAZOLAM HCL 5 MG/5ML IJ SOLN
INTRAMUSCULAR | Status: DC | PRN
  Administered 2016-10-03: 1 mg via INTRAVENOUS

## 2016-10-03 MED ORDER — PROPOFOL 500 MG/50ML IV EMUL
INTRAVENOUS | Status: DC | PRN
  Administered 2016-10-03: 150 ug/kg/min via INTRAVENOUS

## 2016-10-03 MED ORDER — LIDOCAINE 2% (20 MG/ML) 5 ML SYRINGE
INTRAMUSCULAR | Status: DC | PRN
  Administered 2016-10-03: 40 mg via INTRAVENOUS

## 2016-10-03 MED ORDER — FENTANYL CITRATE (PF) 100 MCG/2ML IJ SOLN
INTRAMUSCULAR | Status: DC | PRN
  Administered 2016-10-03: 50 ug via INTRAVENOUS

## 2016-10-03 MED ORDER — EPHEDRINE SULFATE 50 MG/ML IJ SOLN
INTRAMUSCULAR | Status: DC | PRN
  Administered 2016-10-03: 10 mg via INTRAVENOUS

## 2016-10-03 MED ORDER — PROPOFOL 10 MG/ML IV BOLUS
INTRAVENOUS | Status: DC | PRN
  Administered 2016-10-03: 100 mg via INTRAVENOUS

## 2016-10-03 NOTE — Transfer of Care (Signed)
Immediate Anesthesia Transfer of Care Note  Patient: Clinton Lyons  Procedure(s) Performed: Procedure(s): COLONOSCOPY WITH PROPOFOL (N/A)  Patient Location: PACU and Endoscopy Unit  Anesthesia Type:General  Level of Consciousness: awake, drowsy and patient cooperative  Airway & Oxygen Therapy: Patient Spontanous Breathing and Patient connected to nasal cannula oxygen  Post-op Assessment: Report given to RN and Post -op Vital signs reviewed and stable  Post vital signs: Reviewed and stable  Last Vitals:  Vitals:   10/03/16 1320 10/03/16 1440  BP: 115/78   Pulse: 68   Resp: 20   Temp: (!) 36.2 C 36.9 C    Last Pain:  Vitals:   10/03/16 1440  TempSrc: Tympanic      Patients Stated Pain Goal: 0 (09/98/33 8250)  Complications: No apparent anesthesia complications

## 2016-10-03 NOTE — Anesthesia Post-op Follow-up Note (Cosign Needed)
Anesthesia QCDR form completed.        

## 2016-10-03 NOTE — Anesthesia Preprocedure Evaluation (Signed)
Anesthesia Evaluation  Patient identified by MRN, date of birth, ID band Patient awake    Reviewed: Allergy & Precautions, H&P , NPO status , Patient's Chart, lab work & pertinent test results  History of Anesthesia Complications Negative for: history of anesthetic complications  Airway Mallampati: III  TM Distance: >3 FB Neck ROM: limited    Dental  (+) Poor Dentition, Chipped   Pulmonary neg pulmonary ROS, neg shortness of breath,           Cardiovascular Exercise Tolerance: Good hypertension, (-) angina+ CAD, + Past MI and + CABG  (-) DOE      Neuro/Psych negative neurological ROS  negative psych ROS   GI/Hepatic negative GI ROS, Neg liver ROS, neg GERD  ,  Endo/Other  negative endocrine ROS  Renal/GU negative Renal ROS  negative genitourinary   Musculoskeletal   Abdominal   Peds  Hematology negative hematology ROS (+)   Anesthesia Other Findings Past Medical History: 11/17/2015: CAD (coronary artery disease) 11/15/2015: Essential hypertension No date: Glaucoma No date: Hemorrhoids No date: History of colon polyps 11/16/2015: NSTEMI (non-ST elevated myocardial infarction)* 11/19/2015: S/P CABG x 3     Comment: LIMA to LAD, RIMA to D2, SVG to OM1, EVH via               right thigh  Past Surgical History: 11/16/2015: CARDIAC CATHETERIZATION N/A     Comment: Procedure: Left Heart Cath and Coronary               Angiography;  Surgeon: Isaias Cowman, MD;              Location: [place] [facility];  Service:               Cardiovascular;  Laterality: N/A; No date: CORONARY ANGIOPLASTY 11/19/2015: CORONARY ARTERY BYPASS GRAFT N/A     Comment: Procedure: CORONARY ARTERY BYPASS GRAFTING               (CABG), ON PUMP, TIMES THREE, USING BILATERAL               INTERNAL MAMMARY ARTERIES, RIGHT GREATER               SAPHENOUS VEIN HARVESTED ENDOSCOPICALLY;                Surgeon: Rexene Alberts, MD;   Location: [place];              Service: Open Heart Surgery;  Laterality: N/A;               LIMA to LAD, RIMA to DIAGONAL 2, and SVG to OM1 2 weeks ago: rt back precancerous lesion   Right 11/19/2015: TEE WITHOUT CARDIOVERSION N/A     Comment: Procedure: TRANSESOPHAGEAL ECHOCARDIOGRAM               (TEE);  Surgeon: Rexene Alberts, MD;                Location: [place];  Service: Open Heart Surgery;               Laterality: N/A;  BMI    Body Mass Index:  29.39 kg/m      Reproductive/Obstetrics negative OB ROS                             Anesthesia Physical Anesthesia Plan  ASA: III  Anesthesia Plan: General   Post-op Pain  Management:    Induction: Intravenous  PONV Risk Score and Plan:   Airway Management Planned: Natural Airway and Nasal Cannula  Additional Equipment:   Intra-op Plan:   Post-operative Plan:   Informed Consent: I have reviewed the patients History and Physical, chart, labs and discussed the procedure including the risks, benefits and alternatives for the proposed anesthesia with the patient or authorized representative who has indicated his/her understanding and acceptance.   Dental Advisory Given  Plan Discussed with: Anesthesiologist, CRNA and Surgeon  Anesthesia Plan Comments: (Patient consented for risks of anesthesia including but not limited to:  - adverse reactions to medications - damage to teeth, lips or other oral mucosa - sore throat or hoarseness - Damage to heart, brain, lungs or loss of life  Patient voiced understanding.)        Anesthesia Quick Evaluation

## 2016-10-03 NOTE — H&P (Signed)
Primary Care Physician:  Katheren Shams Primary Gastroenterologist:  Dr. Vira Agar  Pre-Procedure History & Physical: HPI:  Clinton Lyons is a 61 y.o. male is here for an colonoscopy.   Past Medical History:  Diagnosis Date  . CAD (coronary artery disease) 11/17/2015  . Essential hypertension 11/15/2015  . Glaucoma   . Hemorrhoids   . History of colon polyps   . NSTEMI (non-ST elevated myocardial infarction) ([name]) 11/16/2015  . 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 CARDIAC CATHETERIZATION N/A 11/16/2015   Procedure: Left Heart Cath and Coronary Angiography;  Surgeon: Isaias Cowman, MD;  Location: [place] [facility];  Service: Cardiovascular;  Laterality: N/A;  . CORONARY ANGIOPLASTY    . CORONARY ARTERY BYPASS GRAFT N/A 11/19/2015   Procedure: CORONARY ARTERY BYPASS GRAFTING (CABG), ON PUMP, TIMES THREE, USING BILATERAL INTERNAL MAMMARY ARTERIES, RIGHT GREATER SAPHENOUS VEIN HARVESTED ENDOSCOPICALLY;  Surgeon: Rexene Alberts, MD;  Location: [place];  Service: Open Heart Surgery;  Laterality: N/A;  LIMA to LAD, RIMA to DIAGONAL 2, and SVG to OM1  . rt back precancerous lesion   Right 2 weeks ago  . TEE WITHOUT CARDIOVERSION N/A 11/19/2015   Procedure: TRANSESOPHAGEAL ECHOCARDIOGRAM (TEE);  Surgeon: Rexene Alberts, MD;  Location: [place];  Service: Open Heart Surgery;  Laterality: N/A;    Prior to Admission medications   Medication Sig Start Date End Date Taking? Authorizing Provider  acetaminophen (TYLENOL) 325 MG tablet Take 2 tablets (650 mg total) by mouth every 6 (six) hours as needed for mild pain. 11/24/15  Yes Lars Pinks M, PA-C  aspirin EC 325 MG EC tablet Take 1 tablet (325 mg total) by mouth daily. 11/24/15  Yes Lars Pinks M, PA-C  atorvastatin (LIPITOR) 40 MG tablet Take 1 tablet (40 mg total) by mouth daily at 6 PM. 11/24/15  Yes Nani Skillern, PA-C  Cholecalciferol (VITAMIN D3) 2000 units capsule Take 4,000 Units by mouth every morning.    Yes [provider]  glucosamine-chondroitin 500-400 MG tablet Take 1 tablet by mouth every morning.   Yes [provider]  latanoprost (XALATAN) 0.005 % ophthalmic solution Place 1 drop into the right eye at bedtime.   Yes [provider]  lisinopril (PRINIVIL,ZESTRIL) 5 MG tablet Take by mouth daily.  12/14/15  Yes [provider]  metoprolol tartrate (LOPRESSOR) 25 MG tablet Take 50 mg by mouth 2 (two) times daily.  12/14/15  Yes [provider]  Multiple Vitamins-Minerals (PX ADVANCED FORMULA MULTIVITS) TABS Take 1 tablet by mouth every morning.   Yes [provider]  polyethylene glycol-electrolytes (NULYTELY/GOLYTELY) 420 g solution Take 4,000 mLs by mouth once.   Yes [provider]  Timolol Maleate 0.5 % (DAILY) SOLN Apply 1 drop to eye daily.   Yes [provider]    Allergies as of 07/11/2016 - Review Complete 02/29/2016  Allergen Reaction Noted  . No known allergies  11/18/2015    Family History  Problem Relation Age of Onset  . CAD Brother     Social History   Social History  . Marital status: Married    Spouse name: N/A  . Number of children: N/A  . Years of education: N/A   Occupational History  . Not on file.   Social History Main Topics  . Smoking status: Never Smoker  . Smokeless  tobacco: Never Used  . Alcohol use No  . Drug use: No  . Sexual activity: Not on file   Other Topics Concern  . Not on file   Social History Narrative  . No narrative on file    Review of Systems: See HPI, otherwise negative ROS  Physical Exam: BP 115/78   Pulse 68   Temp (!) 97.2 F (36.2 C) (Tympanic)   Resp 20   Ht 5\' 9"  (1.753 m)   Wt 90.3 kg (199 lb)   SpO2 98%   BMI 29.39 kg/m  General:   Alert,  pleasant and cooperative in NAD Head:  Normocephalic and atraumatic. Neck:  Supple;  no masses or thyromegaly. Lungs:  Clear throughout to auscultation.    Heart:  Regular rate and rhythm. Abdomen:  Soft, nontender and nondistended. Normal bowel sounds, without guarding, and without rebound.   Neurologic:  Alert and  oriented x4;  grossly normal neurologically.  Impression/Plan: [name] is here for an colonoscopy to be performed for [facility] colon polyps.  Risks, benefits, limitations, and alternatives regarding  colonoscopy have been reviewed with the patient.  Questions have been answered.  All parties agreeable.   Gaylyn Cheers, MD  10/03/2016, 2:05 PM

## 2016-10-03 NOTE — Op Note (Signed)
[facility] Gastroenterology Patient Name: Clinton Lyons Procedure Date: 10/03/2016 1:55 PM MRN: [id number] Account #: [id number] Date of Birth: [birth year] Admit Type: Outpatient Age: 61 Room: [facility] ENDO ROOM 1 Gender: Male Note Status: Finalized Procedure:            Colonoscopy Indications:          High risk colon cancer surveillance: Personal history                        of colonic polyps Providers:            Manya Silvas, MD Referring MD:         Dion Body (Referring MD) Medicines:            Propofol per Anesthesia Complications:        No immediate complications. Procedure:            Pre-Anesthesia Assessment:                       - After reviewing the risks and benefits, the patient                        was deemed in satisfactory condition to undergo the                        procedure.                       After obtaining informed consent, the colonoscope was                        passed under direct vision. Throughout the procedure,                        the patient's blood pressure, pulse, and oxygen                        saturations were monitored continuously. The                        Colonoscope was introduced through the anus and                        advanced to the the cecum, identified by appendiceal                        orifice and ileocecal valve. The colonoscopy was                        performed without difficulty. The patient tolerated the                        procedure well. The quality of the bowel preparation                        was excellent. Findings:      A diminutive polyp was found in the rectum. The polyp was sessile. The       polyp was removed with a jumbo cold forceps. Resection and retrieval       were complete.      A few small-mouthed diverticula were found in  the sigmoid colon.      Internal hemorrhoids were found during endoscopy. The hemorrhoids were       small and Grade I  (internal hemorrhoids that do not prolapse).      The exam was otherwise without abnormality. Impression:           - One diminutive polyp in the rectum, removed with a                        jumbo cold forceps. Resected and retrieved.                       - Diverticulosis in the sigmoid colon.                       - Internal hemorrhoids.                       - The examination was otherwise normal. Recommendation:       - Await pathology results. Manya Silvas, MD 10/03/2016 2:37:00 PM This report has been signed electronically. Number of Addenda: 0 Note Initiated On: 10/03/2016 1:55 PM Scope Withdrawal Time: 0 hours 11 minutes 3 seconds  Total Procedure Duration: 0 hours 20 minutes 24 seconds       [facility]

## 2016-10-04 ENCOUNTER — Encounter: Payer: Self-pay | Admitting: Unknown Physician Specialty

## 2016-10-04 NOTE — Anesthesia Postprocedure Evaluation (Signed)
Anesthesia Post Note  Patient: Clinton Lyons  Procedure(s) Performed: Procedure(s) (LRB): COLONOSCOPY WITH PROPOFOL (N/A)  Patient location during evaluation: Endoscopy Anesthesia Type: General Level of consciousness: awake and alert Pain management: pain level controlled Vital Signs Assessment: post-procedure vital signs reviewed and stable Respiratory status: spontaneous breathing, nonlabored ventilation, respiratory function stable and patient connected to nasal cannula oxygen Cardiovascular status: blood pressure returned to baseline and stable Postop Assessment: no signs of nausea or vomiting Anesthetic complications: no     Last Vitals:  Vitals:   10/03/16 1440 10/03/16 1441  BP:  (!) 89/57  Pulse:  79  Resp:  18  Temp: 36.9 C 36.9 C    Last Pain:  Vitals:   10/03/16 1440  TempSrc: Tympanic                 Precious Haws Craven Crean

## 2016-10-06 LAB — SURGICAL PATHOLOGY

## 2016-12-05 ENCOUNTER — Encounter: Payer: Self-pay | Admitting: Thoracic Surgery (Cardiothoracic Vascular Surgery)

## 2016-12-05 ENCOUNTER — Ambulatory Visit (INDEPENDENT_AMBULATORY_CARE_PROVIDER_SITE_OTHER): Payer: BLUE CROSS/BLUE SHIELD | Admitting: Thoracic Surgery (Cardiothoracic Vascular Surgery)

## 2016-12-05 VITALS — BP 110/78 | HR 59 | Resp 16 | Ht 69.0 in | Wt 201.0 lb

## 2016-12-05 DIAGNOSIS — Z951 Presence of aortocoronary bypass graft: Secondary | ICD-10-CM | POA: Diagnosis not present

## 2016-12-05 NOTE — Patient Instructions (Signed)
Continue all previous medications without any changes at this time  Make every effort to maintain a "heart-healthy" lifestyle with regular physical exercise and adherence to a low-fat, low-carbohydrate diet.  Continue to seek regular follow-up appointments with your primary care physician and/or cardiologist. 

## 2016-12-05 NOTE — Progress Notes (Signed)
      Clinton Lyons,Clinton Lyons             [phone]     CARDIOTHORACIC SURGERY OFFICE NOTE  Referring Provider is Clinton Cowman, MD PCP is Clinton Lyons   HPI:  Patient is a 61 year old male with history of coronary artery disease, hypertension, and hyperlipidemia who returns to the office today for routine follow-up approximately one year status post coronary artery bypass grafting 3 on 11/19/2015 for severe left main and three-vessel coronary artery disease status post acute non-ST segment elevation myocardial infarction. The patient's postoperative recovery was uneventful and he was last seen here in our office on 02/29/2016 at which time he was doing well. Since then he has continued to do very well. He is followed up readily by Clinton Lyons in [place], whom he has a follow-up appointment with tomorrow. The patient reports that he is doing exceptionally well. He is quite active physically and exercises regularly at a gym. He reports no physical limitations whatsoever. He specifically denies any symptoms of exertional chest pain or chest tightness. His only complaint is that his golf swing is not as fast as it used to be.   Current Outpatient Prescriptions  Medication Sig Dispense Refill  . aspirin EC 325 MG EC tablet Take 1 tablet (325 mg total) by mouth daily. 30 tablet 0  . atorvastatin (LIPITOR) 40 MG tablet Take 1 tablet (40 mg total) by mouth daily at 6 PM. 30 tablet 1  . latanoprost (XALATAN) 0.005 % ophthalmic solution Place 1 drop into the right eye at bedtime.    Marland Kitchen lisinopril (PRINIVIL,ZESTRIL) 5 MG tablet Take by mouth daily.     . metoprolol tartrate (LOPRESSOR) 25 MG tablet Take 50 mg by mouth 2 (two) times daily.     . Timolol Maleate 0.5 % (DAILY) SOLN Apply 1 drop to eye daily.    Marland Kitchen VITAMIN D, ERGOCALCIFEROL, PO Take 5,000 Units by mouth daily.     No current facility-administered medications for this visit.        Physical Exam:   BP 110/78 (BP Location: Right Arm, Patient Position: Sitting, Cuff Size: Large)   Pulse (!) 59   Resp 16   Ht 5\' 9"  (1.753 m)   Wt 201 lb (91.2 kg)   SpO2 96% Comment: ON RA  BMI 29.68 kg/m   General:  Well-appearing  Chest:   Clear to auscultation  CV:   Regular rate and rhythm without murmur  Incisions:  Completely healed  Abdomen:  Soft nontender  Extremities:  Warm and well-perfused  Diagnostic Tests:  n/a   Impression:  Patient is doing very well approximately one year status post coronary artery bypass grafting  Plan:  We have not recommended any changes to the patient's current medications. It might be reasonable to decrease his dose of aspirin to 81 mg daily. We discussed the many long-term benefits of regular exercise and a heart healthy diet. All of his questions have been addressed. In the future he will call and return to see [place] only should specific problems or questions arise.  I spent in excess of 15 minutes during the conduct of this office consultation and >50% of this time involved direct face-to-face encounter with the patient for counseling and/or coordination of their care.    Clinton Gu. Roxy Manns, MD 12/05/2016 11:45 AM

## 2017-04-30 IMAGING — CR DG CHEST 2V
2 series · 2 of 2 positions shown · non-contrast
Comparison: 11/21/2015.

CLINICAL DATA: CABG.

EXAM:
CHEST  2 VIEW

[w chest pa]
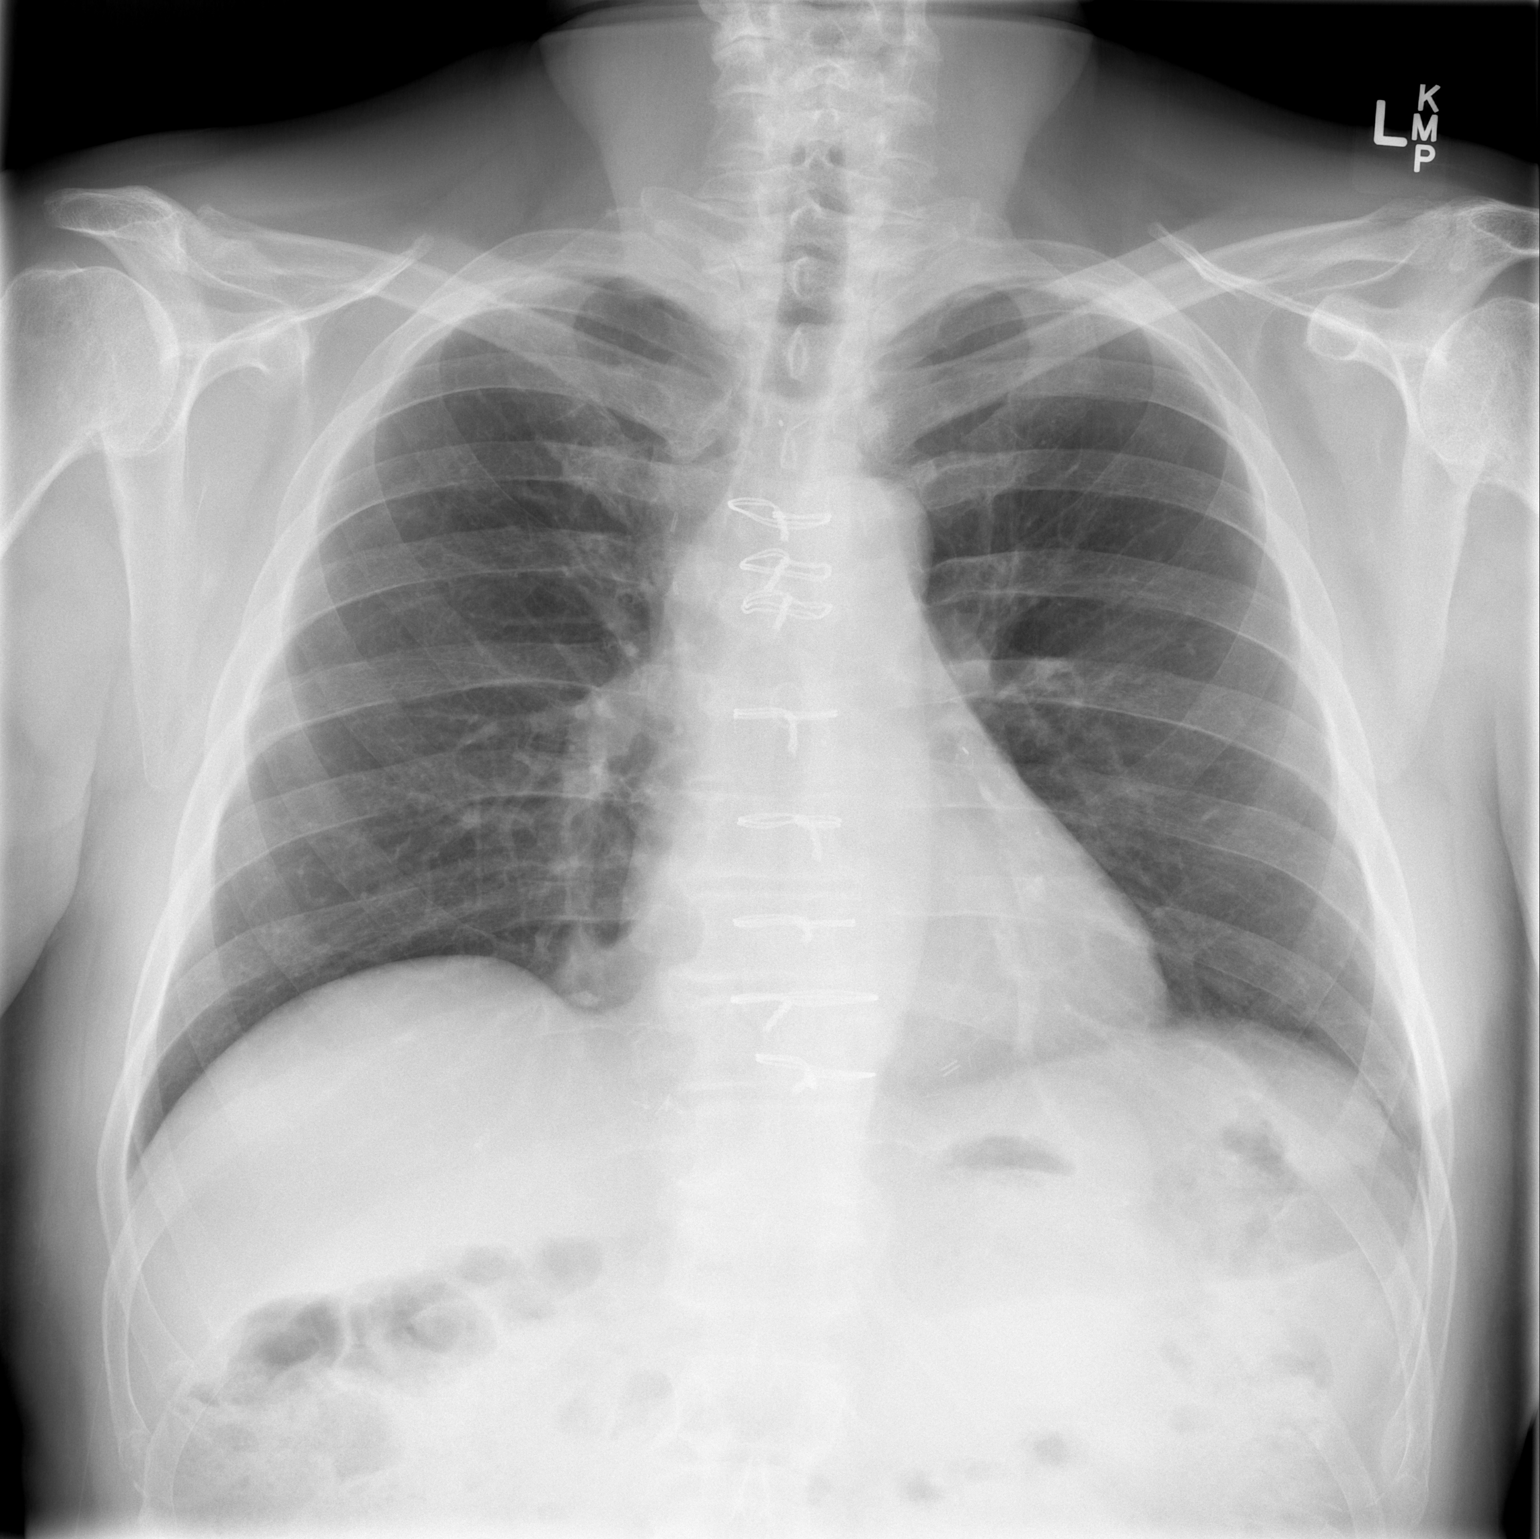

[w chest lat]
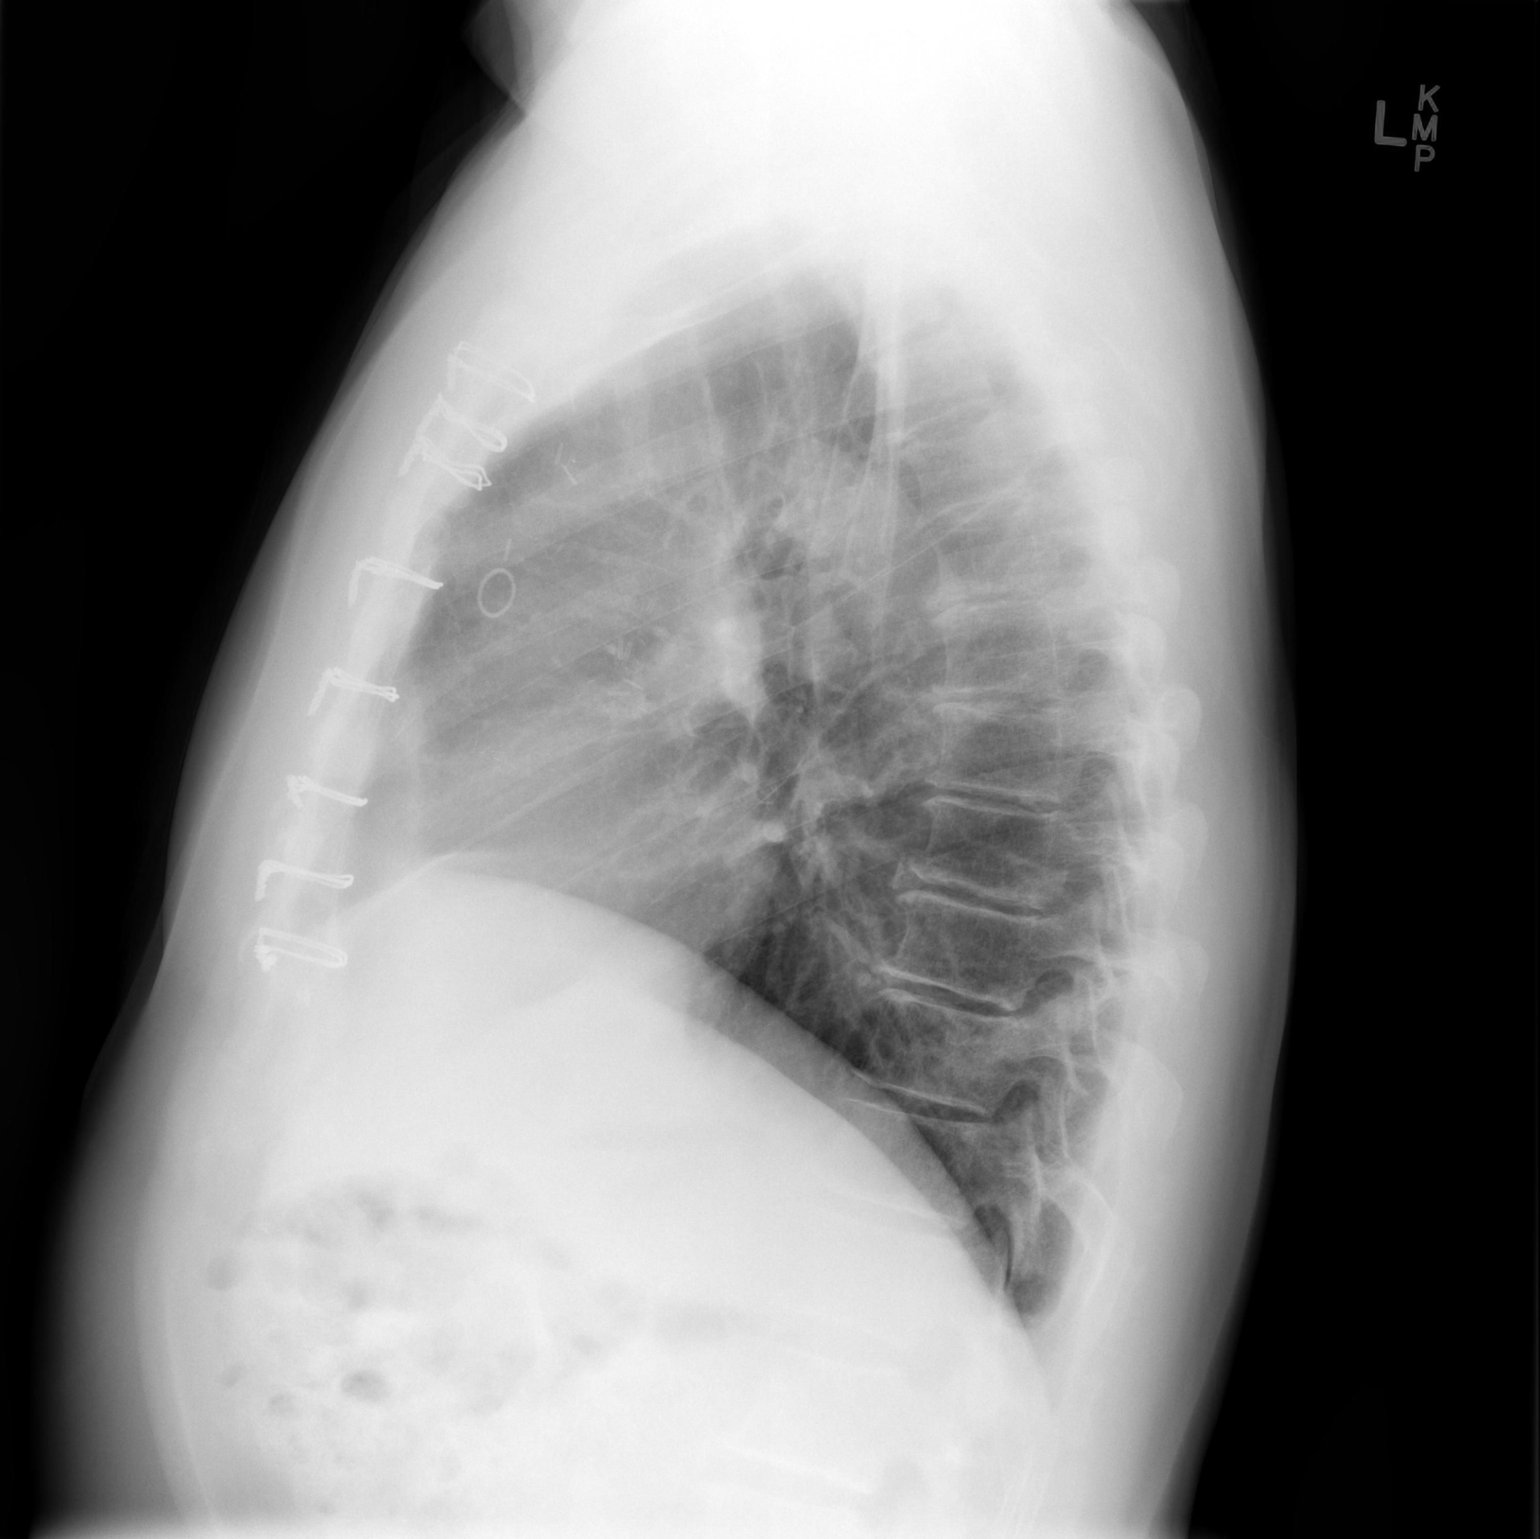

[2 of 2 positions shown; findings below may reference images not displayed]

FINDINGS: Mediastinum and hilar structures normal. Prior CABG. Heart size
stable. Low lung volumes with mild bibasilar atelectasis, improved
from prior exam. No pleural effusion or pneumothorax.
IMPRESSION: 1. Low lung volumes with bibasilar atelectasis, improved from prior
exam. Follow-up chest x-rays recommended demonstrate clearing.

2. Prior CABG.  Heart size normal.

## 2019-04-29 ENCOUNTER — Ambulatory Visit: Payer: BLUE CROSS/BLUE SHIELD

## 2019-05-05 ENCOUNTER — Ambulatory Visit: Payer: BLUE CROSS/BLUE SHIELD

## 2021-06-18 ENCOUNTER — Other Ambulatory Visit: Payer: Self-pay

## 2021-06-18 ENCOUNTER — Ambulatory Visit: Payer: Self-pay | Attending: Internal Medicine

## 2021-06-18 DIAGNOSIS — Z23 Encounter for immunization: Secondary | ICD-10-CM

## 2021-06-18 MED ORDER — PFIZER COVID-19 VAC BIVALENT 30 MCG/0.3ML IM SUSP
INTRAMUSCULAR | 0 refills | Status: AC
  Filled 2021-06-18: qty 0.3, 1d supply, fill #0

## 2021-10-05 ENCOUNTER — Telehealth: Payer: Self-pay | Admitting: Gastroenterology

## 2021-10-05 NOTE — Telephone Encounter (Signed)
Pt called back to sched colonoscopy.

## 2021-10-06 ENCOUNTER — Other Ambulatory Visit: Payer: Self-pay

## 2021-10-06 ENCOUNTER — Telehealth: Payer: Self-pay

## 2021-10-06 DIAGNOSIS — Z86010 Personal history of colonic polyps: Secondary | ICD-10-CM

## 2021-10-06 MED ORDER — NA SULFATE-K SULFATE-MG SULF 17.5-3.13-1.6 GM/177ML PO SOLN: 1.0000 | Freq: Once | ORAL | 0 refills | Status: AC

## 2021-10-06 NOTE — Telephone Encounter (Signed)
/   Gastroenterology Pre-Procedure Review  Request Date: 12/07/21 Requesting Physician: Dr. Allen Norris  PATIENT REVIEW QUESTIONS: The patient responded to the following health history questions as indicated:    1. Are you having any GI issues? no 2. Do you have a personal history of Polyps? Yes 10/03/16  3. Do you have a family history of Colon Cancer or Polyps? no 4. Diabetes Mellitus? no 5. Joint replacements in the past 12 months?no 6. Major health problems in the past 3 months?no 7. Any artificial heart valves, MVP, or defibrillator? Triple bypass 2017 Dr. Saralyn Pilar cardiologist    MEDICATIONS & ALLERGIES:    Patient reports the following regarding taking any anticoagulation/antiplatelet therapy:   Plavix, Coumadin, Eliquis, Xarelto, Lovenox, Pradaxa, Brilinta, or Effient? no Aspirin? yes ('81mg'$  daily)  Patient confirms/reports the following medications:  Current Outpatient Medications  Medication Sig Dispense Refill   aspirin EC 325 MG EC tablet Take 1 tablet (325 mg total) by mouth daily. 30 tablet 0   atorvastatin (LIPITOR) 40 MG tablet Take 1 tablet (40 mg total) by mouth daily at 6 PM. 30 tablet 1   COVID-19 mRNA bivalent vaccine, Pfizer, (PFIZER COVID-19 VAC BIVALENT) injection Inject into the muscle. 0.3 mL 0   latanoprost (XALATAN) 0.005 % ophthalmic solution Place 1 drop into the right eye at bedtime.     lisinopril (PRINIVIL,ZESTRIL) 5 MG tablet Take by mouth daily.      metoprolol tartrate (LOPRESSOR) 25 MG tablet Take 50 mg by mouth 2 (two) times daily.      Timolol Maleate 0.5 % (DAILY) SOLN Apply 1 drop to eye daily.     VITAMIN D, ERGOCALCIFEROL, PO Take 5,000 Units by mouth daily.     No current facility-administered medications for this visit.    Patient confirms/reports the following allergies:  Allergies  Allergen Reactions   No Known Allergies     No orders of the defined types were placed in this encounter.   AUTHORIZATION INFORMATION Primary  Insurance: 1D#: Group #:  Secondary Insurance: 1D#: Group #:  SCHEDULE INFORMATION: Date: 12/07/21 Time: Location: [facility]

## 2021-12-07 ENCOUNTER — Other Ambulatory Visit: Payer: Self-pay

## 2021-12-07 ENCOUNTER — Ambulatory Visit: Payer: Medicare HMO | Admitting: Anesthesiology

## 2021-12-07 ENCOUNTER — Ambulatory Visit
Admission: RE | Admit: 2021-12-07 | Discharge: 2021-12-07 | Disposition: A | Discharge status: Home / Self Care | Payer: Medicare HMO | Attending: Gastroenterology | Admitting: Gastroenterology

## 2021-12-07 ENCOUNTER — Encounter
Admission: RE | Disposition: A | Discharge status: Home / Self Care | Payer: Self-pay | Source: Home / Self Care | Attending: Gastroenterology

## 2021-12-07 ENCOUNTER — Encounter: Payer: Self-pay | Admitting: Gastroenterology

## 2021-12-07 DIAGNOSIS — K5730 Diverticulosis of large intestine without perforation or abscess without bleeding: Secondary | ICD-10-CM | POA: Insufficient documentation

## 2021-12-07 DIAGNOSIS — I10 Essential (primary) hypertension: Secondary | ICD-10-CM | POA: Diagnosis not present

## 2021-12-07 DIAGNOSIS — I2510 Atherosclerotic heart disease of native coronary artery without angina pectoris: Secondary | ICD-10-CM | POA: Diagnosis not present

## 2021-12-07 DIAGNOSIS — Z951 Presence of aortocoronary bypass graft: Secondary | ICD-10-CM | POA: Diagnosis not present

## 2021-12-07 DIAGNOSIS — Z86010 Personal history of colonic polyps: Secondary | ICD-10-CM | POA: Insufficient documentation

## 2021-12-07 DIAGNOSIS — Z1211 Encounter for screening for malignant neoplasm of colon: Secondary | ICD-10-CM | POA: Insufficient documentation

## 2021-12-07 DIAGNOSIS — I252 Old myocardial infarction: Secondary | ICD-10-CM | POA: Diagnosis not present

## 2021-12-07 DIAGNOSIS — K640 First degree hemorrhoids: Secondary | ICD-10-CM | POA: Insufficient documentation

## 2021-12-07 HISTORY — PX: COLONOSCOPY WITH PROPOFOL: SHX5780

## 2021-12-07 SURGERY — COLONOSCOPY WITH PROPOFOL
Anesthesia: General

## 2021-12-07 MED ORDER — SODIUM CHLORIDE 0.9 % IV SOLN: INTRAVENOUS | Status: DC

## 2021-12-07 MED ORDER — PHENYLEPHRINE 80 MCG/ML (10ML) SYRINGE FOR IV PUSH (FOR BLOOD PRESSURE SUPPORT)
PREFILLED_SYRINGE | INTRAVENOUS | Status: AC
  Filled 2021-12-07: qty 10

## 2021-12-07 MED ORDER — PROPOFOL 10 MG/ML IV BOLUS
INTRAVENOUS | Status: DC | PRN
  Administered 2021-12-07: 80 mg via INTRAVENOUS

## 2021-12-07 MED ORDER — LIDOCAINE HCL (CARDIAC) PF 100 MG/5ML IV SOSY
PREFILLED_SYRINGE | INTRAVENOUS | Status: DC | PRN
  Administered 2021-12-07: 100 mg via INTRAVENOUS

## 2021-12-07 MED ORDER — PHENYLEPHRINE HCL (PRESSORS) 10 MG/ML IV SOLN
INTRAVENOUS | Status: DC | PRN
  Administered 2021-12-07 (×3): 160 ug via INTRAVENOUS

## 2021-12-07 MED ORDER — PROPOFOL 500 MG/50ML IV EMUL
INTRAVENOUS | Status: DC | PRN
  Administered 2021-12-07: 200 ug/kg/min via INTRAVENOUS

## 2021-12-07 NOTE — Anesthesia Preprocedure Evaluation (Addendum)
Anesthesia Evaluation  Patient identified by MRN, date of birth, ID band Patient awake    Reviewed: Allergy & Precautions, NPO status , Patient's Chart, lab work & pertinent test results, reviewed documented beta blocker date and time   Airway Mallampati: II  TM Distance: >3 FB Neck ROM: full    Dental no notable dental hx.    Pulmonary neg pulmonary ROS,    Pulmonary exam normal        Cardiovascular Exercise Tolerance: Good hypertension, (-) angina+ CAD, + Past MI and + CABG  (-) DOE Normal cardiovascular exam     Neuro/Psych negative neurological ROS  negative psych ROS   GI/Hepatic negative GI ROS, Neg liver ROS,   Endo/Other  negative endocrine ROS  Renal/GU negative Renal ROS  negative genitourinary   Musculoskeletal   Abdominal Normal abdominal exam  (+)   Peds  Hematology negative hematology ROS (+)   Anesthesia Other Findings Past Medical History: 11/17/2015: CAD (coronary artery disease) 11/15/2015: Essential hypertension No date: Glaucoma No date: Hemorrhoids No date: History of colon polyps 11/16/2015: NSTEMI (non-ST elevated myocardial infarction) ([place]) 11/19/2015: S/P CABG x 3     Comment:  LIMA to LAD, RIMA to D2, SVG to OM1, EVH via right thigh  Past Surgical History: 11/16/2015: CARDIAC CATHETERIZATION; N/A     Comment:  Procedure: Left Heart Cath and Coronary Angiography;                Surgeon: Isaias Cowman, MD;  Location: [place] [facility];  Service: Cardiovascular;  Laterality:               N/A; 10/03/2016: COLONOSCOPY WITH PROPOFOL; N/A     Comment:  Procedure: COLONOSCOPY WITH PROPOFOL;  Surgeon: Manya Silvas, MD;  Location: [facility];  Service:               Endoscopy;  Laterality: N/A; No date: CORONARY ANGIOPLASTY 11/19/2015: CORONARY ARTERY BYPASS GRAFT; N/A     Comment:  Procedure: CORONARY ARTERY BYPASS GRAFTING (CABG), ON                PUMP, TIMES THREE, USING BILATERAL INTERNAL MAMMARY               ARTERIES, RIGHT GREATER SAPHENOUS VEIN HARVESTED               ENDOSCOPICALLY;  Surgeon: Rexene Alberts, MD;  Location:              [place];  Service: Open Heart Surgery;  Laterality: N/A;                LIMA to LAD, RIMA to DIAGONAL 2, and SVG to OM1 2 weeks ago: rt back precancerous lesion  ; Right 11/19/2015: TEE WITHOUT CARDIOVERSION; N/A     Comment:  Procedure: TRANSESOPHAGEAL ECHOCARDIOGRAM (TEE);                Surgeon: Rexene Alberts, MD;  Location: [place];  Service:              Open Heart Surgery;  Laterality: N/A;     Reproductive/Obstetrics negative OB ROS                            Anesthesia Physical Anesthesia Plan  ASA: 3  Anesthesia Plan: General   Post-op Pain Management: Minimal or no pain anticipated   Induction: Intravenous  PONV Risk Score and Plan: Propofol infusion and TIVA  Airway Management Planned: Natural Airway  Additional Equipment:   Intra-op Plan:   Post-operative Plan:   Informed Consent: I have reviewed the patients History and Physical, chart, labs and discussed the procedure including the risks, benefits and alternatives for the proposed anesthesia with the patient or authorized representative who has indicated his/her understanding and acceptance.     Dental Advisory Given  Plan Discussed with: Anesthesiologist, CRNA and Surgeon  Anesthesia Plan Comments:        Anesthesia Quick Evaluation

## 2021-12-07 NOTE — H&P (Signed)
Lucilla Lame, MD [address] [place] [place], [place] [address] Phone:[phone] Fax : [phone]  Primary Care Physician:  Dion Body, MD Primary Gastroenterologist:  Dr. Allen Norris  Pre-Procedure History & Physical: HPI:  Clinton Lyons is a 66 y.o. male is here for an colonoscopy.   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   LIMA to LAD, RIMA to D2, SVG to OM1, EVH via right thigh    Past Surgical History:  Procedure Laterality Date   CARDIAC CATHETERIZATION N/A 11/16/2015   Procedure: Left Heart Cath and Coronary Angiography;  Surgeon: Isaias Cowman, MD;  Location: [place] [facility];  Service: Cardiovascular;  Laterality: N/A;   COLONOSCOPY WITH PROPOFOL N/A 10/03/2016   Procedure: COLONOSCOPY WITH PROPOFOL;  Surgeon: Manya Silvas, MD;  Location: [facility] ENDOSCOPY;  Service: Endoscopy;  Laterality: N/A;   CORONARY ARTERY BYPASS GRAFT N/A 11/19/2015   Procedure: CORONARY ARTERY BYPASS GRAFTING (CABG), ON PUMP, TIMES THREE, USING BILATERAL INTERNAL MAMMARY ARTERIES, RIGHT GREATER SAPHENOUS VEIN HARVESTED ENDOSCOPICALLY;  Surgeon: Rexene Alberts, MD;  Location: [place];  Service: Open Heart Surgery;  Laterality: N/A;  LIMA to LAD, RIMA to DIAGONAL 2, and SVG to OM1   rt back precancerous lesion   Right 2 weeks ago   TEE WITHOUT CARDIOVERSION N/A 11/19/2015   Procedure: TRANSESOPHAGEAL ECHOCARDIOGRAM (TEE);  Surgeon: Rexene Alberts, MD;  Location: [place];  Service: Open Heart Surgery;  Laterality: N/A;    Prior to Admission medications   Medication Sig Start Date End Date Taking? Authorizing Provider  aspirin EC 81 MG tablet Take by mouth. 11/24/15  Yes [provider]  atorvastatin (LIPITOR) 40 MG tablet Take 1 tablet (40 mg  total) by mouth daily at 6 PM. 11/24/15  Yes Tacy Dura, Donielle M, PA-C  latanoprost (XALATAN) 0.005 % ophthalmic solution Place 1 drop into the right eye at bedtime.   Yes [provider]  lisinopril (PRINIVIL,ZESTRIL) 5 MG tablet Take by mouth daily.  12/14/15  Yes [provider]  metoprolol tartrate (LOPRESSOR) 25 MG tablet Take 50 mg by mouth 2 (two) times daily.  12/14/15  Yes [provider]  Timolol Maleate 0.5 % (DAILY) SOLN Apply 1 drop to eye daily.   Yes [provider]  VITAMIN D, ERGOCALCIFEROL, PO Take 5,000 Units by mouth daily.   Yes [provider]  COVID-19 mRNA bivalent vaccine, Pfizer, (PFIZER COVID-19 VAC BIVALENT) injection Inject into the muscle. 06/18/21   Carlyle Basques, MD    Allergies as of 10/06/2021 - Review Complete 10/06/2021  Allergen Reaction Noted   No known allergies  11/18/2015    Family History  Problem Relation Age of Onset   CAD Brother     Social History   Socioeconomic History   Marital status: Married    Spouse name: Not on file   Number of children: Not on file   Years of education: Not on file   Highest education level: Not on file  Occupational History   Not on file  Tobacco Use   Smoking status: Never   Smokeless tobacco: Never  Substance and Sexual Activity   Alcohol use: No   Drug use: No   Sexual activity: Not on file  Other Topics Concern  Not on file  Social History Narrative   Not on file   Social Determinants of Health   Financial Resource Strain: Not on file  Food Insecurity: Not on file  Transportation Needs: Not on file  Physical Activity: Not on file  Stress: Not on file  Social Connections: Not on file  Intimate Partner Violence: Not on file    Review of Systems: See HPI, otherwise negative ROS  Physical Exam: BP 109/89   Pulse 87   Temp (!) 97.1 F (36.2 C) (Temporal)   Resp 16   Ht '5\' 9"'$  (1.753 m)   Wt 85.7 kg   SpO2 98%   BMI 27.91 kg/m  General:    Alert,  pleasant and cooperative in NAD Head:  Normocephalic and atraumatic. Neck:  Supple; no masses or thyromegaly. Lungs:  Clear throughout to auscultation.    Heart:  Regular rate and rhythm. Abdomen:  Soft, nontender and nondistended. Normal bowel sounds, without guarding, and without rebound.   Neurologic:  Alert and  oriented x4;  grossly normal neurologically.  Impression/Plan: Clinton Lyons is here for an colonoscopy to be performed for a history of adenomatous polyps on 2018   Risks, benefits, limitations, and alternatives regarding  colonoscopy have been reviewed with the patient.  Questions have been answered.  All parties agreeable.   Lucilla Lame, MD  12/07/2021, 9:02 AM

## 2021-12-07 NOTE — Op Note (Signed)
[facility] Patient Name: Clinton Lyons Procedure Date: 12/07/2021 8:51 AM MRN: [id number] Account #: [id number] Date of Birth: 25-Sep-1955 Admit Type: Outpatient Age: 66 Room: [facility] ENDO ROOM 4 Gender: Male Note Status: Finalized Instrument Name: [id number] Procedure:             Colonoscopy Indications:           High risk colon cancer surveillance: Personal history                         of colonic polyps Providers:             Lucilla Lame MD, MD Medicines:             Propofol per Anesthesia Complications:         No immediate complications. Procedure:             Pre-Anesthesia Assessment:                        - Prior to the procedure, a History and Physical was                         performed, and patient medications and allergies were                         reviewed. The patient's tolerance of previous                         anesthesia was also reviewed. The risks and benefits                         of the procedure and the sedation options and risks                         were discussed with the patient. All questions were                         answered, and informed consent was obtained. Prior                         Anticoagulants: The patient has taken no previous                         anticoagulant or antiplatelet agents. ASA Grade                         Assessment: II - A patient with mild systemic disease.                         After reviewing the risks and benefits, the patient                         was deemed in satisfactory condition to undergo the                         procedure.                        After obtaining informed consent, the colonoscope was  passed under direct vision. Throughout the procedure,                         the patient's blood pressure, pulse, and oxygen                         saturations were monitored continuously. The                         Colonoscope was  introduced through the anus and                         advanced to the the cecum, identified by appendiceal                         orifice and ileocecal valve. The colonoscopy was                         performed without difficulty. The patient tolerated                         the procedure well. The quality of the bowel                         preparation was excellent. Findings:      The perianal and digital rectal examinations were normal.      A few small-mouthed diverticula were found in the sigmoid colon.      Non-bleeding internal hemorrhoids were found during retroflexion. The       hemorrhoids were Grade I (internal hemorrhoids that do not prolapse). Impression:            - Diverticulosis in the sigmoid colon.                        - Non-bleeding internal hemorrhoids.                        - No specimens collected. Recommendation:        - Discharge patient to home.                        - Resume previous diet.                        - Continue present medications.                        - Repeat colonoscopy in 7 years for surveillance. Procedure Code(s):     --- Professional ---                        [phone], Colonoscopy, flexible; diagnostic, including                         collection of specimen(s) by brushing or washing, when                         performed (separate procedure) Diagnosis Code(s):     --- Professional ---  Z86.010, Personal history of colonic polyps CPT copyright 2019 [employer]. All rights reserved. The codes documented in this report are preliminary and upon coder review may  be revised to meet current compliance requirements. Lucilla Lame MD, MD 12/07/2021 9:45:47 AM This report has been signed electronically. Number of Addenda: 0 Note Initiated On: 12/07/2021 8:51 AM Scope Withdrawal Time: 0 hours 11 minutes 29 seconds  Total Procedure Duration: 0 hours 16 minutes 6 seconds  Estimated Blood Loss:   Estimated blood loss: none.      [facility]

## 2021-12-07 NOTE — Anesthesia Postprocedure Evaluation (Signed)
Anesthesia Post Note  Patient: Clinton Lyons  Procedure(s) Performed: COLONOSCOPY WITH PROPOFOL  Patient location during evaluation: Endoscopy Anesthesia Type: General Level of consciousness: awake and alert Pain management: pain level controlled Vital Signs Assessment: post-procedure vital signs reviewed and stable Respiratory status: spontaneous breathing, nonlabored ventilation and respiratory function stable Cardiovascular status: blood pressure returned to baseline and stable Postop Assessment: no apparent nausea or vomiting Anesthetic complications: no   No notable events documented.   Last Vitals:  Vitals:   12/07/21 1010 12/07/21 1012  BP: 102/80 102/80  Pulse: 80 76  Resp: 17 11  Temp:    SpO2: 99% 99%    Last Pain:  Vitals:   12/07/21 0940  TempSrc: Temporal  PainSc:                  [place] Ouch

## 2021-12-07 NOTE — Transfer of Care (Signed)
Immediate Anesthesia Transfer of Care Note  Patient: Clinton Lyons  Procedure(s) Performed: COLONOSCOPY WITH PROPOFOL  Patient Location: Endoscopy Unit  Anesthesia Type:General  Level of Consciousness: drowsy  Airway & Oxygen Therapy: Patient Spontanous Breathing  Post-op Assessment: Report given to RN and Post -op Vital signs reviewed and stable  Post vital signs: Reviewed and stable  Last Vitals:  Vitals Value Taken Time  BP 81/58 12/07/21 0949  Temp    Pulse 80 12/07/21 0949  Resp 20 12/07/21 0949  SpO2 93 % 12/07/21 0949    Last Pain:  Vitals:   12/07/21 0940  TempSrc: Temporal  PainSc:          Complications: No notable events documented.

## 2021-12-08 ENCOUNTER — Encounter: Payer: Self-pay | Admitting: Gastroenterology
